# Patient Record
Sex: Female | Born: 1990
Health system: Southern US, Community
[De-identification: ages and names within clinical notes are randomized; demographics above are authoritative.]

## PROBLEM LIST (undated history)

## (undated) DIAGNOSIS — J45909 Unspecified asthma, uncomplicated: Secondary | ICD-10-CM

---
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
## 2012-01-01 ENCOUNTER — Encounter: Payer: BC Managed Care – PPO | Admitting: Physical Therapy

## 2012-01-07 ENCOUNTER — Ambulatory Visit: Payer: BC Managed Care – PPO | Admitting: Physical Therapy

## 2012-01-28 ENCOUNTER — Telehealth: Payer: Self-pay | Admitting: Radiology

## 2012-01-28 NOTE — Telephone Encounter (Signed)
Have gotten a request from [facility] for Tens unit/ please advise if you want to fill out medical necessity form for this item. I will fill out, you can sign, or if not appropriate let me know.

## 2012-01-30 NOTE — Telephone Encounter (Signed)
Only saw her once We need to be involved if ordering this equipment Any notes from PT to review??? Seems to long to be in pt w/out other imaging I need to see her before doing this

## 2012-01-30 NOTE — Telephone Encounter (Signed)
Called patient and advised, she states she has already gotten the TENS unsure who authorized this.

## 2012-02-02 ENCOUNTER — Telehealth: Payer: Self-pay

## 2012-02-02 NOTE — Telephone Encounter (Signed)
Please answer this phone call for me as I will not be here on Tuesday Note that we have not seen the patient since October and I'm not sure we are the ones to be responsible for signing for Tens unit

## 2012-02-02 NOTE — Telephone Encounter (Signed)
FOR DR Merla RichesVictorino Dike IS A PHYSICAL THERAPIST FROM [employer] AND WOULD LIKE TO SPEAK WITH YOU REGARDING A TENS UNIT ON PT PLEASE CALL [phone]

## 2012-02-03 NOTE — Telephone Encounter (Signed)
Yes, I called patient last week about the TENS unit, and we needed to see patient. She had already gotten the unit. I am not sure why they gave her the unit without your order. Patient was advised she needs office visit. I left message for her to call me back.

## 2012-02-03 NOTE — Telephone Encounter (Signed)
I spoke to her and she advised there is a signed Rx for this. She states you signed the order for this on Dec 4th. I have advised therapy patient has to be seen for this. Sherry Phelps Patient will come in for return office visit.

## 2012-09-06 ENCOUNTER — Telehealth: Payer: Self-pay

## 2012-09-06 NOTE — Telephone Encounter (Signed)
Request for records to be sent.

## 2012-09-06 NOTE — Telephone Encounter (Signed)
SONIA FROM [facility] STATES THEY NEED THE LAST AND CURRENT OV NOTES ON PT REGARDING THE 10 UNITS THAT WAS DELIVERED ON PT YOU MAY CALL HER AT [phone] AND THE FAX IS [phone]

## 2012-09-07 NOTE — Telephone Encounter (Signed)
Faxed

## 2013-02-23 ENCOUNTER — Emergency Department (HOSPITAL_COMMUNITY): Payer: BC Managed Care – PPO

## 2013-02-23 ENCOUNTER — Encounter (HOSPITAL_COMMUNITY): Payer: Self-pay | Admitting: Emergency Medicine

## 2013-02-23 ENCOUNTER — Emergency Department (HOSPITAL_COMMUNITY)
Admission: EM | Admit: 2013-02-23 | Discharge: 2013-02-23 | Disposition: A | Discharge status: Home / Self Care | Payer: BC Managed Care – PPO | Attending: Emergency Medicine | Admitting: Emergency Medicine

## 2013-02-23 DIAGNOSIS — R519 Headache, unspecified: Secondary | ICD-10-CM

## 2013-02-23 DIAGNOSIS — IMO0001 Reserved for inherently not codable concepts without codable children: Secondary | ICD-10-CM | POA: Insufficient documentation

## 2013-02-23 DIAGNOSIS — J45909 Unspecified asthma, uncomplicated: Secondary | ICD-10-CM | POA: Insufficient documentation

## 2013-02-23 DIAGNOSIS — R51 Headache: Secondary | ICD-10-CM | POA: Insufficient documentation

## 2013-02-23 DIAGNOSIS — Z79899 Other long term (current) drug therapy: Secondary | ICD-10-CM | POA: Insufficient documentation

## 2013-02-23 DIAGNOSIS — R52 Pain, unspecified: Secondary | ICD-10-CM | POA: Insufficient documentation

## 2013-02-23 DIAGNOSIS — J40 Bronchitis, not specified as acute or chronic: Secondary | ICD-10-CM

## 2013-02-23 HISTORY — DX: Unspecified asthma, uncomplicated: J45.909

## 2013-02-23 MED ORDER — SODIUM CHLORIDE 0.9 % IV BOLUS (SEPSIS)
1000.0000 mL | Freq: Once | INTRAVENOUS | Status: AC
  Administered 2013-02-23: 1000 mL via INTRAVENOUS

## 2013-02-23 MED ORDER — PREDNISONE 20 MG PO TABS: 40.0000 mg | ORAL_TABLET | Freq: Every day | ORAL | Status: DC

## 2013-02-23 MED ORDER — METOCLOPRAMIDE HCL 5 MG/ML IJ SOLN
10.0000 mg | Freq: Once | INTRAMUSCULAR | Status: AC
  Administered 2013-02-23: 10 mg via INTRAVENOUS
  Filled 2013-02-23: qty 2

## 2013-02-23 MED ORDER — DIPHENHYDRAMINE HCL 50 MG/ML IJ SOLN
25.0000 mg | Freq: Once | INTRAMUSCULAR | Status: AC
  Administered 2013-02-23: 25 mg via INTRAVENOUS
  Filled 2013-02-23: qty 1

## 2013-02-23 MED ORDER — ALBUTEROL SULFATE HFA 108 (90 BASE) MCG/ACT IN AERS: 2.0000 | INHALATION_SPRAY | Freq: Four times a day (QID) | RESPIRATORY_TRACT | Status: DC | PRN

## 2013-02-23 MED ORDER — METOCLOPRAMIDE HCL 10 MG PO TABS: 10.0000 mg | ORAL_TABLET | Freq: Three times a day (TID) | ORAL | Status: DC | PRN

## 2013-02-23 MED ORDER — PROCHLORPERAZINE EDISYLATE 5 MG/ML IJ SOLN
10.0000 mg | Freq: Once | INTRAMUSCULAR | Status: AC
  Administered 2013-02-23: 10 mg via INTRAVENOUS
  Filled 2013-02-23: qty 2

## 2013-02-23 NOTE — ED Provider Notes (Signed)
CSN: [id number]     Arrival date & time 02/23/13  1253 History  This chart was scribed for Francee Piccolo, PA-C, working with Shanna Cisco, MD by Blanchard Kelch, ED Scribe. This patient was seen in room TR06C/TR06C and the patient's care was started at 1:20 PM.    Chief Complaint  Patient presents with  . Cough  . Generalized Body Aches  . Headache    Patient is a 23 y.o. female presenting with cough and headaches. The history is provided by the patient. No language interpreter was used.  Cough Associated symptoms: chills, headaches and myalgias   Headache Associated symptoms: cough and myalgias     HPI Comments: Sherry Phelps is a 23 y.o. female with a history of asthma who presents to the Emergency Department complaining of a constant, dry cough that began about two days ago. She reports associated chills and myalgias. She states that yesterday she had to use her inhaler due to shortness of breath from the cough that subsided with the inhaler use. Patient does endorse SOB was similar to asthma exacerbation symptoms, denies any further SOB today. She also reports a waxing and waning headache that began yesterday. She reports taking one dose of Ibuprofen and Nyquil without relief. She does endorse minimal improvement of her headache with those, along with myalgias. She denies vomiting, current SOB, chest pain or unilateral leg swelling. She denies a history DVT/PE. She denies recent surgeries, fractures, recent long distance travel. She uses Nuvaring for birth control. She denies sick contacts. She did not receive her flu vaccination this season.     Past Medical History  Diagnosis Date  . Asthma    History reviewed. No pertinent past surgical history. No family history on file. History  Substance Use Topics  . Smoking status: Never Smoker   . Smokeless tobacco: Not on file  . Alcohol Use: Yes   OB History   Grav Para Term Preterm Abortions TAB SAB Ect Mult Living                  Review of Systems  Constitutional: Positive for chills.  Respiratory: Positive for cough.   Musculoskeletal: Positive for myalgias.  Neurological: Positive for headaches. Negative for syncope.  All other systems reviewed and are negative.    Allergies  Review of patient's allergies indicates no known allergies.  Home Medications   Current Outpatient Rx  Name  Route  Sig  Dispense  Refill  . albuterol (PROVENTIL HFA;VENTOLIN HFA) 108 (90 BASE) MCG/ACT inhaler   Inhalation   Inhale 1-2 puffs into the lungs every 6 (six) hours as needed for wheezing or shortness of breath.         Marland Kitchen albuterol (PROVENTIL) (2.5 MG/3ML) 0.083% nebulizer solution   Nebulization   Take 2.5 mg by nebulization every 6 (six) hours as needed for wheezing or shortness of breath.          . budesonide-formoterol (SYMBICORT) 160-4.5 MCG/ACT inhaler   Inhalation   Inhale 2 puffs into the lungs 2 (two) times daily.         Marland Kitchen etonogestrel-ethinyl estradiol (NUVARING) 0.12-0.015 MG/24HR vaginal ring   Vaginal   Place 1 each vaginally every 28 (twenty-eight) days. Insert vaginally and leave in place for 3 consecutive weeks, then remove for 1 week.         Marland Kitchen albuterol (PROVENTIL HFA;VENTOLIN HFA) 108 (90 BASE) MCG/ACT inhaler   Inhalation   Inhale 2 puffs into the lungs every 6 (  six) hours as needed for wheezing or shortness of breath.   1 Inhaler   2   . metoCLOPramide (REGLAN) 10 MG tablet   Oral   Take 1 tablet (10 mg total) by mouth 3 (three) times daily as needed for nausea (headache / nausea).   10 tablet   0   . predniSONE (DELTASONE) 20 MG tablet   Oral   Take 2 tablets (40 mg total) by mouth daily.   10 tablet   0    Triage Vitals: BP 132/84  Pulse 126  Temp(Src) 99.4 F (37.4 C) (Oral)  Resp 18  Ht 5\' 3"  (1.6 m)  Wt 150 lb (68.04 kg)  BMI 26.58 kg/m2  SpO2 100%  LMP 02/01/2013  Physical Exam  Nursing note and vitals reviewed. Constitutional: She is oriented to  person, place, and time. She appears well-developed and well-nourished. No distress.  HENT:  Head: Normocephalic and atraumatic.  Right Ear: External ear normal.  Left Ear: External ear normal.  Nose: Nose normal.  Mouth/Throat: Uvula is midline and oropharynx is clear and moist. No oropharyngeal exudate, posterior oropharyngeal edema or posterior oropharyngeal erythema.  Eyes: Conjunctivae and EOM are normal. Pupils are equal, round, and reactive to light.  Neck: Normal range of motion. Neck supple.  Cardiovascular: Normal rate, regular rhythm, normal heart sounds and intact distal pulses.   Pulmonary/Chest: Effort normal and breath sounds normal. No respiratory distress. She exhibits no tenderness.  Abdominal: Soft. There is no tenderness.  Musculoskeletal: Normal range of motion.  Lymphadenopathy:    She has no cervical adenopathy.  Neurological: She is alert and oriented to person, place, and time. She has normal strength. No cranial nerve deficit or sensory deficit. Gait normal. GCS eye subscore is 4. GCS verbal subscore is 5. GCS motor subscore is 6.  No pronator drift. Bilateral heel-knee-shin intact.  Skin: Skin is warm and dry. She is not diaphoretic.  Psychiatric: She has a normal mood and affect.    ED Course  Procedures (including critical care time) Medications  sodium chloride 0.9 % bolus 1,000 mL (0 mLs Intravenous Stopped 02/23/13 1516)  diphenhydrAMINE (BENADRYL) injection 25 mg (25 mg Intravenous Given 02/23/13 1347)  prochlorperazine (COMPAZINE) injection 10 mg (10 mg Intravenous Given 02/23/13 1347)  metoCLOPramide (REGLAN) injection 10 mg (10 mg Intravenous Given 02/23/13 1457)     DIAGNOSTIC STUDIES: Oxygen Saturation is 100% on room air, normal by my interpretation.    COORDINATION OF CARE: 1:25 PM -Will order chest x-ray. Patient verbalizes understanding and agrees with treatment plan.    Labs Review Labs Reviewed - No data to display Imaging Review Dg  Chest 2 View  02/23/2013   CLINICAL DATA:  Cough and congestion, history of asthma.  EXAM: CHEST  2 VIEW  COMPARISON:  Chest and left rib series dated November 05, 2011  FINDINGS: The lungs are well-expanded. There is no focal infiltrate. The cardiac silhouette is normal in size. The mediastinum is normal in width. The perihilar interstitial markings are minimally prominent but stable. There is no pleural effusion or pneumothorax. The observed portions of the bony thorax appear normal.  IMPRESSION: There is no evidence of pneumonia nor CHF. One cannot exclude acute bronchitis in the appropriate clinical setting.   Electronically Signed   By: David  [place]   On: 02/23/2013 14:19    EKG Interpretation   None       MDM   1. Bronchitis   2. Headache     Filed  Vitals:   02/23/13 1410  BP:   Pulse: 101  Temp:   Resp:    Afebrile, NAD, non-toxic appearing, AAOx4.   1) Bronchitis: Lungs clear to auscultation bilaterally. Dry cough appreciated on examination. No respiratory distress. No hypoxia. Patient initially tachycardic on arrival. This improved with IV fluids and pain medication. Patient without shortness of breath, hypoxia, chest pain. Symptoms related to asthma exacerbation with bronchitis. Will treat with Prednisone and inhaler.   2) Headache: Pt HA treated and improved while in ED.  Presentation is non concerning for [facility], ICH, Meningitis, or temporal arteritis. Pt is afebrile with no focal neuro deficits, nuchal rigidity, or change in vision. Pt is to follow up with PCP to discuss prophylactic medication. Pt verbalizes understanding and is agreeable with plan to dc.   Patient d/w with Dr. Micheline Maze, agrees with plan.     I personally performed the services described in this documentation, which was scribed in my presence. The recorded information has been reviewed and is accurate.     Jeannetta Ellis, PA-C 02/23/13 1538

## 2013-02-23 NOTE — ED Notes (Signed)
Denies SOB.

## 2013-02-23 NOTE — ED Notes (Signed)
PT ambulated with baseline gait; VSS; A&Ox3; no signs of distress; respirations even and unlabored; skin warm and dry; no questions upon discharge.  

## 2013-02-23 NOTE — ED Provider Notes (Signed)
Medical screening examination/treatment/procedure(s) were performed by non-physician practitioner and as supervising physician I was immediately available for consultation/collaboration.  EKG Interpretation   None         Megan E Docherty, MD 02/23/13 2002 

## 2013-02-23 NOTE — ED Notes (Signed)
Patient states has had cough, headache and body aches.   Patient states cough is dry.   Patient 8/10 headache/body aches.

## 2013-02-23 NOTE — Discharge Instructions (Signed)
Please follow up with your primary care physician in 1-2 days. If you do not have one [facility] listed above. Please take medications as prescribed. Please read all discharge instructions and return precautions.   Bronchitis Bronchitis is inflammation of the airways that extend from the windpipe into the lungs (bronchi). The inflammation often causes mucus to develop, which leads to a cough. If the inflammation becomes severe, it may cause shortness of breath. CAUSES  Bronchitis may be caused by:   Viral infections.   Bacteria.   Cigarette smoke.   Allergens, pollutants, and other irritants.  SIGNS AND SYMPTOMS  The most common symptom of bronchitis is a frequent cough that produces mucus. Other symptoms include:  Fever.   Body aches.   Chest congestion.   Chills.   Shortness of breath.   Sore throat.  DIAGNOSIS  Bronchitis is usually diagnosed through a medical history and physical exam. Tests, such as chest X-rays, are sometimes done to rule out other conditions.  TREATMENT  You may need to avoid contact with whatever caused the problem (smoking, for example). Medicines are sometimes needed. These may include:  Antibiotics. These may be prescribed if the condition is caused by bacteria.  Cough suppressants. These may be prescribed for relief of cough symptoms.   Inhaled medicines. These may be prescribed to help open your airways and make it easier for you to breathe.   Steroid medicines. These may be prescribed for those with recurrent (chronic) bronchitis. HOME CARE INSTRUCTIONS  Get plenty of rest.   Drink enough fluids to keep your urine clear or pale yellow (unless you have a medical condition that requires fluid restriction). Increasing fluids may help thin your secretions and will prevent dehydration.   Only take over-the-counter or prescription medicines as directed by your health care provider.  Only  take antibiotics as directed. Make sure you finish them even if you start to feel better.  Avoid secondhand smoke, irritating chemicals, and strong fumes. These will make bronchitis worse. If you are a smoker, quit smoking. Consider using nicotine gum or skin patches to help control withdrawal symptoms. Quitting smoking will help your lungs heal faster.   Put a cool-mist humidifier in your bedroom at night to moisten the air. This may help loosen mucus. Change the water in the humidifier daily. You can also run the hot water in your shower and sit in the bathroom with the door closed for 5 10 minutes.   Follow up with your health care provider as directed.   Wash your hands frequently to avoid catching bronchitis again or spreading an infection to others.  SEEK MEDICAL CARE IF: Your symptoms do not improve after 1 week of treatment.  SEEK IMMEDIATE MEDICAL CARE IF:  Your fever increases.  You have chills.   You have chest pain.   You have worsening shortness of breath.   You have bloody sputum.  You faint.  You have lightheadedness.  You have a severe headache.   You vomit repeatedly. MAKE SURE YOU:   Understand these instructions.  Will watch your condition.  Will get help right away if you are not doing well or get worse. Document Released: 01/13/2005 Document Revised: 11/03/2012 Document Reviewed: 09/07/2012 [facility] Patient Information 2014 [employer] [place]. Headaches, Frequently Asked Questions MIGRAINE HEADACHES Q: What is migraine? What causes it? How can I treat it? A: Generally, migraine headaches begin as a dull ache. Then they develop into a constant, throbbing,  and pulsating pain. You may experience pain at the temples. You may experience pain at the front or back of one or both sides of the head. The pain is usually accompanied by a combination of:  Nausea.  Vomiting.  Sensitivity to light and noise. Some people (about 15%) experience an aura  (see below) before an attack. The cause of migraine is believed to be chemical reactions in the brain. Treatment for migraine may include over-the-counter or prescription medications. It may also include self-help techniques. These include relaxation training and biofeedback.  Q: What is an aura? A: About 15% of people with migraine get an "aura". This is a sign of neurological symptoms that occur before a migraine headache. You may see wavy or jagged lines, dots, or flashing lights. You might experience tunnel vision or blind spots in one or both eyes. The aura can include visual or auditory hallucinations (something imagined). It may include disruptions in smell (such as strange odors), taste or touch. Other symptoms include:  Numbness.  A "pins and needles" sensation.  Difficulty in recalling or speaking the correct word. These neurological events may last as long as 60 minutes. These symptoms will fade as the headache begins. Q: What is a trigger? A: Certain physical or environmental factors can lead to or "trigger" a migraine. These include:  Foods.  Hormonal changes.  Weather.  Stress. It is important to remember that triggers are different for everyone. To help prevent migraine attacks, you need to figure out which triggers affect you. Keep a headache diary. This is a good way to track triggers. The diary will help you talk to your healthcare professional about your condition. Q: Does weather affect migraines? A: Bright sunshine, hot, humid conditions, and drastic changes in barometric pressure may lead to, or "trigger," a migraine attack in some people. But studies have shown that weather does not act as a trigger for everyone with migraines. Q: What is the link between migraine and hormones? A: Hormones start and regulate many of your body's functions. Hormones keep your body in balance within a constantly changing environment. The levels of hormones in your body are unbalanced at  times. Examples are during menstruation, pregnancy, or menopause. That can lead to a migraine attack. In fact, about three quarters of all women with migraine report that their attacks are related to the menstrual cycle.  Q: Is there an increased risk of stroke for migraine sufferers? A: The likelihood of a migraine attack causing a stroke is very remote. That is not to say that migraine sufferers cannot have a stroke associated with their migraines. In persons under age 19, the most common associated factor for stroke is migraine headache. But over the course of a person's normal life span, the occurrence of migraine headache may actually be associated with a reduced risk of dying from cerebrovascular disease due to stroke.  Q: What are acute medications for migraine? A: Acute medications are used to treat the pain of the headache after it has started. Examples over-the-counter medications, NSAIDs, ergots, and triptans.  Q: What are the triptans? A: Triptans are the newest class of abortive medications. They are specifically targeted to treat migraine. Triptans are vasoconstrictors. They moderate some chemical reactions in the brain. The triptans work on receptors in your brain. Triptans help to restore the balance of a neurotransmitter called serotonin. Fluctuations in levels of serotonin are thought to be a main cause of migraine.  Q: Are over-the-counter medications for migraine effective? A: Over-the-counter,  or "OTC," medications may be effective in relieving mild to moderate pain and associated symptoms of migraine. But you should see your caregiver before beginning any treatment regimen for migraine.  Q: What are preventive medications for migraine? A: Preventive medications for migraine are sometimes referred to as "prophylactic" treatments. They are used to reduce the frequency, severity, and length of migraine attacks. Examples of preventive medications include antiepileptic medications,  antidepressants, beta-blockers, calcium channel blockers, and NSAIDs (nonsteroidal anti-inflammatory drugs). Q: Why are anticonvulsants used to treat migraine? A: During the past few years, there has been an increased interest in antiepileptic drugs for the prevention of migraine. They are sometimes referred to as "anticonvulsants". Both epilepsy and migraine may be caused by similar reactions in the brain.  Q: Why are antidepressants used to treat migraine? A: Antidepressants are typically used to treat people with depression. They may reduce migraine frequency by regulating chemical levels, such as serotonin, in the brain.  Q: What alternative therapies are used to treat migraine? A: The term "alternative therapies" is often used to describe treatments considered outside the scope of conventional Western medicine. Examples of alternative therapy include acupuncture, acupressure, and yoga. Another common alternative treatment is herbal therapy. Some herbs are believed to relieve headache pain. Always discuss alternative therapies with your caregiver before proceeding. Some herbal products contain arsenic and other toxins. TENSION HEADACHES Q: What is a tension-type headache? What causes it? How can I treat it? A: Tension-type headaches occur randomly. They are often the result of temporary stress, anxiety, fatigue, or anger. Symptoms include soreness in your temples, a tightening band-like sensation around your head (a "vice-like" ache). Symptoms can also include a pulling feeling, pressure sensations, and contracting head and neck muscles. The headache begins in your forehead, temples, or the back of your head and neck. Treatment for tension-type headache may include over-the-counter or prescription medications. Treatment may also include self-help techniques such as relaxation training and biofeedback. CLUSTER HEADACHES Q: What is a cluster headache? What causes it? How can I treat it? A: Cluster  headache gets its name because the attacks come in groups. The pain arrives with little, if any, warning. It is usually on one side of the head. A tearing or bloodshot eye and a runny nose on the same side of the headache may also accompany the pain. Cluster headaches are believed to be caused by chemical reactions in the brain. They have been described as the most severe and intense of any headache type. Treatment for cluster headache includes prescription medication and oxygen. SINUS HEADACHES Q: What is a sinus headache? What causes it? How can I treat it? A: When a cavity in the bones of the face and skull (a sinus) becomes inflamed, the inflammation will cause localized pain. This condition is usually the result of an allergic reaction, a tumor, or an infection. If your headache is caused by a sinus blockage, such as an infection, you will probably have a fever. An x-ray will confirm a sinus blockage. Your caregiver's treatment might include antibiotics for the infection, as well as antihistamines or decongestants.  REBOUND HEADACHES Q: What is a rebound headache? What causes it? How can I treat it? A: A pattern of taking acute headache medications too often can lead to a condition known as "rebound headache." A pattern of taking too much headache medication includes taking it more than 2 days per week or in excessive amounts. That means more than the label or a caregiver advises. With rebound  headaches, your medications not only stop relieving pain, they actually begin to cause headaches. Doctors treat rebound headache by tapering the medication that is being overused. Sometimes your caregiver will gradually substitute a different type of treatment or medication. Stopping may be a challenge. Regularly overusing a medication increases the potential for serious side effects. Consult a caregiver if you regularly use headache medications more than 2 days per week or more than the label advises. ADDITIONAL  QUESTIONS AND ANSWERS Q: What is biofeedback? A: Biofeedback is a self-help treatment. Biofeedback uses special equipment to monitor your body's involuntary physical responses. Biofeedback monitors:  Breathing.  Pulse.  Heart rate.  Temperature.  Muscle tension.  Brain activity. Biofeedback helps you refine and perfect your relaxation exercises. You learn to control the physical responses that are related to stress. Once the technique has been mastered, you do not need the equipment any more. Q: Are headaches hereditary? A: Four out of five (80%) of people that suffer report a family history of migraine. Scientists are not sure if this is genetic or a family predisposition. Despite the uncertainty, a child has a 50% chance of having migraine if one parent suffers. The child has a 75% chance if both parents suffer.  Q: Can children get headaches? A: By the time they reach high school, most young people have experienced some type of headache. Many safe and effective approaches or medications can prevent a headache from occurring or stop it after it has begun.  Q: What type of doctor should I see to diagnose and treat my headache? A: Start with your primary caregiver. Discuss his or her experience and approach to headaches. Discuss methods of classification, diagnosis, and treatment. Your caregiver may decide to recommend you to a headache specialist, depending upon your symptoms or other physical conditions. Having diabetes, allergies, etc., may require a more comprehensive and inclusive approach to your headache. The [employer] will provide, upon request, a list of [facility] physician members in your state. Document Released: 04/05/2003 Document Revised: 04/07/2011 Document Reviewed: 09/13/2007 [facility] Patient Information 2014 [place], [place].

## 2013-11-02 ENCOUNTER — Encounter (HOSPITAL_COMMUNITY): Payer: Self-pay | Admitting: Emergency Medicine

## 2013-11-02 ENCOUNTER — Emergency Department (HOSPITAL_COMMUNITY)
Admission: EM | Admit: 2013-11-02 | Discharge: 2013-11-02 | Disposition: A | Discharge status: Home / Self Care | Payer: Federal, State, Local not specified - PPO | Attending: Emergency Medicine | Admitting: Emergency Medicine

## 2013-11-02 ENCOUNTER — Emergency Department (HOSPITAL_COMMUNITY): Payer: Federal, State, Local not specified - PPO

## 2013-11-02 ENCOUNTER — Emergency Department (HOSPITAL_COMMUNITY)
Admission: EM | Admit: 2013-11-02 | Discharge: 2013-11-02 | Disposition: A | Discharge status: Home / Self Care | Payer: Federal, State, Local not specified - PPO | Source: Home / Self Care | Attending: Family Medicine | Admitting: Family Medicine

## 2013-11-02 DIAGNOSIS — Z7952 Long term (current) use of systemic steroids: Secondary | ICD-10-CM | POA: Diagnosis not present

## 2013-11-02 DIAGNOSIS — R000 Tachycardia, unspecified: Secondary | ICD-10-CM | POA: Diagnosis not present

## 2013-11-02 DIAGNOSIS — J069 Acute upper respiratory infection, unspecified: Secondary | ICD-10-CM | POA: Diagnosis not present

## 2013-11-02 DIAGNOSIS — Z79899 Other long term (current) drug therapy: Secondary | ICD-10-CM | POA: Diagnosis not present

## 2013-11-02 DIAGNOSIS — R0602 Shortness of breath: Secondary | ICD-10-CM | POA: Diagnosis present

## 2013-11-02 DIAGNOSIS — J45901 Unspecified asthma with (acute) exacerbation: Secondary | ICD-10-CM | POA: Diagnosis not present

## 2013-11-02 LAB — CBC WITH DIFFERENTIAL/PLATELET
Basophils Absolute: 0 10*3/uL (ref 0.0–0.1)
Basophils Relative: 0 % (ref 0–1)
Eosinophils Absolute: 0.3 10*3/uL (ref 0.0–0.7)
Eosinophils Relative: 4 % (ref 0–5)
HCT: 38.1 % (ref 36.0–46.0)
Hemoglobin: 13.3 g/dL (ref 12.0–15.0)
LYMPHS ABS: 0.7 10*3/uL (ref 0.7–4.0)
Lymphocytes Relative: 9 % — ABNORMAL LOW (ref 12–46)
MCH: 29.5 pg (ref 26.0–34.0)
MCHC: 34.9 g/dL (ref 30.0–36.0)
MCV: 84.5 fL (ref 78.0–100.0)
Monocytes Absolute: 0.2 10*3/uL (ref 0.1–1.0)
Monocytes Relative: 3 % (ref 3–12)
NEUTROS ABS: 5.9 10*3/uL (ref 1.7–7.7)
Neutrophils Relative %: 84 % — ABNORMAL HIGH (ref 43–77)
PLATELETS: 262 10*3/uL (ref 150–400)
RBC: 4.51 MIL/uL (ref 3.87–5.11)
RDW: 13.2 % (ref 11.5–15.5)
WBC: 7 10*3/uL (ref 4.0–10.5)

## 2013-11-02 LAB — BASIC METABOLIC PANEL
Anion gap: 11 (ref 5–15)
BUN: 9 mg/dL (ref 6–23)
CO2: 23 mEq/L (ref 19–32)
Calcium: 9 mg/dL (ref 8.4–10.5)
Chloride: 103 mEq/L (ref 96–112)
Creatinine, Ser: 0.76 mg/dL (ref 0.50–1.10)
GFR calc Af Amer: >90 mL/min (ref >90)
GFR calc non Af Amer: >90 mL/min (ref >90)
GLUCOSE: 104 mg/dL — AB (ref 70–99)
POTASSIUM: 3.9 meq/L (ref 3.7–5.3)
Sodium: 137 mEq/L (ref 137–147)

## 2013-11-02 LAB — D-DIMER, QUANTITATIVE: D-Dimer, Quant: <0.27 ug/mL-FEU (ref 0.00–0.48)

## 2013-11-02 MED ORDER — BUDESONIDE-FORMOTEROL FUMARATE 160-4.5 MCG/ACT IN AERO: 2.0000 | INHALATION_SPRAY | Freq: Two times a day (BID) | RESPIRATORY_TRACT | Status: AC

## 2013-11-02 MED ORDER — ALBUTEROL SULFATE (2.5 MG/3ML) 0.083% IN NEBU
5.0000 mg | INHALATION_SOLUTION | Freq: Once | RESPIRATORY_TRACT | Status: AC
  Administered 2013-11-02: 5 mg via RESPIRATORY_TRACT
  Filled 2013-11-02: qty 6

## 2013-11-02 MED ORDER — IPRATROPIUM BROMIDE 0.02 % IN SOLN
0.5000 mg | Freq: Once | RESPIRATORY_TRACT | Status: AC
  Administered 2013-11-02: 0.5 mg via RESPIRATORY_TRACT
  Filled 2013-11-02: qty 2.5

## 2013-11-02 MED ORDER — ALBUTEROL SULFATE HFA 108 (90 BASE) MCG/ACT IN AERS
1.0000 | INHALATION_SPRAY | Freq: Four times a day (QID) | RESPIRATORY_TRACT | Status: DC
  Administered 2013-11-02: 2 via RESPIRATORY_TRACT
  Filled 2013-11-02: qty 6.7

## 2013-11-02 MED ORDER — PREDNISONE 50 MG PO TABS: 50.0000 mg | ORAL_TABLET | Freq: Every day | ORAL | Status: DC

## 2013-11-02 MED ORDER — IPRATROPIUM-ALBUTEROL 0.5-2.5 (3) MG/3ML IN SOLN
3.0000 mL | Freq: Once | RESPIRATORY_TRACT | Status: AC
  Administered 2013-11-02: 3 mL via RESPIRATORY_TRACT

## 2013-11-02 MED ORDER — IPRATROPIUM-ALBUTEROL 0.5-2.5 (3) MG/3ML IN SOLN
RESPIRATORY_TRACT | Status: AC
  Filled 2013-11-02: qty 3

## 2013-11-02 MED ORDER — ACETAMINOPHEN 325 MG PO TABS
650.0000 mg | ORAL_TABLET | Freq: Once | ORAL | Status: AC
  Administered 2013-11-02: 650 mg via ORAL
  Filled 2013-11-02: qty 2

## 2013-11-02 NOTE — ED Notes (Addendum)
[facility]. Pt given a neb tx and prescription for prednisone. Pt sts she left and started wheezing again. Pt also reports sore throat.

## 2013-11-02 NOTE — Discharge Instructions (Signed)
Upper Respiratory Infection, Adult °An upper respiratory infection (URI) is also sometimes known as the common cold. The upper respiratory tract includes the nose, sinuses, throat, trachea, and bronchi. Bronchi are the airways leading to the lungs. Most people improve within 1 week, but symptoms can last up to 2 weeks. A residual cough may last even longer.  °CAUSES °Many different viruses can infect the tissues lining the upper respiratory tract. The tissues become irritated and inflamed and often become very moist. Mucus production is also common. A cold is contagious. You can easily spread the virus to others by oral contact. This includes kissing, sharing a glass, coughing, or sneezing. Touching your mouth or nose and then touching a surface, which is then touched by another person, can also spread the virus. °SYMPTOMS  °Symptoms typically develop 1 to 3 days after you come in contact with a cold virus. Symptoms vary from person to person. They may include: °· Runny nose. °· Sneezing. °· Nasal congestion. °· Sinus irritation. °· Sore throat. °· Loss of voice (laryngitis). °· Cough. °· Fatigue. °· Muscle aches. °· Loss of appetite. °· Headache. °· Low-grade fever. °DIAGNOSIS  °You might diagnose your own cold based on familiar symptoms, since most people get a cold 2 to 3 times a year. Your caregiver can confirm this based on your exam. Most importantly, your caregiver can check that your symptoms are not due to another disease such as strep throat, sinusitis, pneumonia, asthma, or epiglottitis. Blood tests, throat tests, and X-rays are not necessary to diagnose a common cold, but they may sometimes be helpful in excluding other more serious diseases. Your caregiver will decide if any further tests are required. °RISKS AND COMPLICATIONS  °You may be at risk for a more severe case of the common cold if you smoke cigarettes, have chronic heart disease (such as heart failure) or lung disease (such as asthma), or if  you have a weakened immune system. The very young and very old are also at risk for more serious infections. Bacterial sinusitis, middle ear infections, and bacterial pneumonia can complicate the common cold. The common cold can worsen asthma and chronic obstructive pulmonary disease (COPD). Sometimes, these complications can require emergency medical care and may be life-threatening. °PREVENTION  °The best way to protect against getting a cold is to practice good hygiene. Avoid oral or hand contact with people with cold symptoms. Wash your hands often if contact occurs. There is no clear evidence that vitamin C, vitamin E, echinacea, or exercise reduces the chance of developing a cold. However, it is always recommended to get plenty of rest and practice good nutrition. °TREATMENT  °Treatment is directed at relieving symptoms. There is no cure. Antibiotics are not effective, because the infection is caused by a virus, not by bacteria. Treatment may include: °· Increased fluid intake. Sports drinks offer valuable electrolytes, sugars, and fluids. °· Breathing heated mist or steam (vaporizer or shower). °· Eating chicken soup or other clear broths, and maintaining good nutrition. °· Getting plenty of rest. °· Using gargles or lozenges for comfort. °· Controlling fevers with ibuprofen or acetaminophen as directed by your caregiver. °· Increasing usage of your inhaler if you have asthma. °Zinc gel and zinc lozenges, taken in the first 24 hours of the common cold, can shorten the duration and lessen the severity of symptoms. Pain medicines may help with fever, muscle aches, and throat pain. A variety of non-prescription medicines are available to treat congestion and runny nose. Your caregiver   can make recommendations and may suggest nasal or lung inhalers for other symptoms.  °HOME CARE INSTRUCTIONS  °· Only take over-the-counter or prescription medicines for pain, discomfort, or fever as directed by your  caregiver. °· Use a warm mist humidifier or inhale steam from a shower to increase air moisture. This may keep secretions moist and make it easier to breathe. °· Drink enough water and fluids to keep your urine clear or pale yellow. °· Rest as needed. °· Return to work when your temperature has returned to normal or as your caregiver advises. You may need to stay home longer to avoid infecting others. You can also use a face mask and careful hand washing to prevent spread of the virus. °SEEK MEDICAL CARE IF:  °· After the first few days, you feel you are getting worse rather than better. °· You need your caregiver's advice about medicines to control symptoms. °· You develop chills, worsening shortness of breath, or brown or red sputum. These may be signs of pneumonia. °· You develop yellow or brown nasal discharge or pain in the face, especially when you bend forward. These may be signs of sinusitis. °· You develop a fever, swollen neck glands, pain with swallowing, or white areas in the back of your throat. These may be signs of strep throat. °SEEK IMMEDIATE MEDICAL CARE IF:  °· You have a fever. °· You develop severe or persistent headache, ear pain, sinus pain, or chest pain. °· You develop wheezing, a prolonged cough, cough up blood, or have a change in your usual mucus (if you have chronic lung disease). °· You develop sore muscles or a stiff neck. °Document Released: 07/09/2000 Document Revised: 04/07/2011 Document Reviewed: 04/20/2013 °ExitCare® Patient Information ©2015 [employer]. This information is not intended to replace advice given to you by your health care provider. Make sure you discuss any questions you have with your health care provider. °Asthma °Asthma is a recurring condition in which the airways tighten and narrow. Asthma can make it difficult to breathe. It can cause coughing, wheezing, and shortness of breath. Asthma episodes, also called asthma attacks, range from minor to  life-threatening. Asthma cannot be cured, but medicines and lifestyle changes can help control it. °CAUSES °Asthma is believed to be caused by inherited (genetic) and environmental factors, but its exact cause is unknown. Asthma may be triggered by allergens, lung infections, or irritants in the air. Asthma triggers are different for each person. Common triggers include:  °· Animal dander. °· Dust mites. °· Cockroaches. °· Pollen from trees or grass. °· Mold. °· Smoke. °· Air pollutants such as dust, household cleaners, hair sprays, aerosol sprays, paint fumes, strong chemicals, or strong odors. °· Cold air, weather changes, and winds (which increase molds and pollens in the air). °· Strong emotional expressions such as crying or laughing hard. °· Stress. °· Certain medicines (such as aspirin) or types of drugs (such as beta-blockers). °· Sulfites in foods and drinks. Foods and drinks that may contain sulfites include dried fruit, potato chips, and sparkling grape juice. °· Infections or inflammatory conditions such as the flu, a cold, or an inflammation of the nasal membranes (rhinitis). °· Gastroesophageal reflux disease (GERD). °· Exercise or strenuous activity. °SYMPTOMS °Symptoms may occur immediately after asthma is triggered or many hours later. Symptoms include: °· Wheezing. °· Excessive nighttime or early morning coughing. °· Frequent or severe coughing with a common cold. °· Chest tightness. °· Shortness of breath. °DIAGNOSIS  °The diagnosis of asthma is   made by a review of your medical history and a physical exam. Tests may also be performed. These may include: °· Lung function studies. These tests show how much air you breathe in and out. °· Allergy tests. °· Imaging tests such as X-rays. °TREATMENT  °Asthma cannot be cured, but it can usually be controlled. Treatment involves identifying and avoiding your asthma triggers. It also involves medicines. There are 2 classes of medicine used for asthma  treatment:  °· Controller medicines. These prevent asthma symptoms from occurring. They are usually taken every day. °· Reliever or rescue medicines. These quickly relieve asthma symptoms. They are used as needed and provide short-term relief. °Your health care provider will help you create an asthma action plan. An asthma action plan is a written plan for managing and treating your asthma attacks. It includes a list of your asthma triggers and how they may be avoided. It also includes information on when medicines should be taken and when their dosage should be changed. An action plan may also involve the use of a device called a peak flow meter. A peak flow meter measures how well the lungs are working. It helps you monitor your condition. °HOME CARE INSTRUCTIONS  °· Take medicines only as directed by your health care provider. Speak with your health care provider if you have questions about how or when to take the medicines. °· Use a peak flow meter as directed by your health care provider. Record and keep track of readings. °· Understand and use the action plan to help minimize or stop an asthma attack without needing to seek medical care. °· Control your home environment in the following ways to help prevent asthma attacks: °¨ Do not smoke. Avoid being exposed to secondhand smoke. °¨ Change your heating and air conditioning filter regularly. °¨ Limit your use of fireplaces and wood stoves. °¨ Get rid of pests (such as roaches and mice) and their droppings. °¨ Throw away plants if you see mold on them. °¨ Clean your floors and dust regularly. Use unscented cleaning products. °¨ Try to have someone else vacuum for you regularly. Stay out of rooms while they are being vacuumed and for a short while afterward. If you vacuum, use a dust mask from a hardware store, a double-layered or microfilter vacuum cleaner bag, or a vacuum cleaner with a HEPA filter. °¨ Replace carpet with wood, tile, or vinyl flooring. Carpet  can trap dander and dust. °¨ Use allergy-proof pillows, mattress covers, and box spring covers. °¨ Wash bed sheets and blankets every week in hot water and dry them in a dryer. °¨ Use blankets that are made of polyester or cotton. °¨ Clean bathrooms and kitchens with bleach. If possible, have someone repaint the walls in these rooms with mold-resistant paint. Keep out of the rooms that are being cleaned and painted. °¨ Wash hands frequently. °SEEK MEDICAL CARE IF:  °· You have wheezing, shortness of breath, or a cough even if taking medicine to prevent attacks. °· The colored mucus you cough up (sputum) is thicker than usual. °· Your sputum changes from clear or white to yellow, green, gray, or bloody. °· You have any problems that may be related to the medicines you are taking (such as a rash, itching, swelling, or trouble breathing). °· You are using a reliever medicine more than 2-3 times per week. °· Your peak flow is still at 50-79% of your personal best after following your action plan for 1 hour. °· You   have a fever. °SEEK IMMEDIATE MEDICAL CARE IF:  °· You seem to be getting worse and are unresponsive to treatment during an asthma attack. °· You are short of breath even at rest. °· You get short of breath when doing very little physical activity. °· You have difficulty eating, drinking, or talking due to asthma symptoms. °· You develop chest pain. °· You develop a fast heartbeat. °· You have a bluish color to your lips or fingernails. °· You are light-headed, dizzy, or faint. °· Your peak flow is less than 50% of your personal best. °MAKE SURE YOU:  °· Understand these instructions. °· Will watch your condition. °· Will get help right away if you are not doing well or get worse. °Document Released: 01/13/2005 Document Revised: 05/30/2013 Document Reviewed: 08/12/2012 °ExitCare® Patient Information ©2015 [employer]. This information is not intended to replace advice given to you by your health care  provider. Make sure you discuss any questions you have with your health care provider. ° °

## 2013-11-02 NOTE — ED Provider Notes (Signed)
Sherry Phelps is a 23 y.o. female who presents to Urgent Care today for asthma exacerbation. Patient has a 2-day history of worsening shortness of breath and wheezing associated with Raynaud's cough congestion and headache. She's been using her albuterol inhaler much more frequently than usual. It helps only temporarily. No fevers or chills nausea vomiting or diarrhea. No chest pains or palpitations   Past Medical History  Diagnosis Date  . Asthma    History  Substance Use Topics  . Smoking status: Never Smoker   . Smokeless tobacco: Not on file  . Alcohol Use: Yes   ROS as above Medications: No current facility-administered medications for this encounter.   Current Outpatient Prescriptions  Medication Sig Dispense Refill  . albuterol (PROVENTIL HFA;VENTOLIN HFA) 108 (90 BASE) MCG/ACT inhaler Inhale 1-2 puffs into the lungs every 6 (six) hours as needed for wheezing or shortness of breath.      Marland Kitchen. albuterol (PROVENTIL HFA;VENTOLIN HFA) 108 (90 BASE) MCG/ACT inhaler Inhale 2 puffs into the lungs every 6 (six) hours as needed for wheezing or shortness of breath.  1 Inhaler  2  . albuterol (PROVENTIL) (2.5 MG/3ML) 0.083% nebulizer solution Take 2.5 mg by nebulization every 6 (six) hours as needed for wheezing or shortness of breath.       . budesonide-formoterol (SYMBICORT) 160-4.5 MCG/ACT inhaler Inhale 2 puffs into the lungs 2 (two) times daily.  1 Inhaler  0  . etonogestrel-ethinyl estradiol (NUVARING) 0.12-0.015 MG/24HR vaginal ring Place 1 each vaginally every 28 (twenty-eight) days. Insert vaginally and leave in place for 3 consecutive weeks, then remove for 1 week.      . predniSONE (DELTASONE) 50 MG tablet Take 1 tablet (50 mg total) by mouth daily.  5 tablet  0  . [DISCONTINUED] metoCLOPramide (REGLAN) 10 MG tablet Take 1 tablet (10 mg total) by mouth 3 (three) times daily as needed for nausea (headache / nausea).  10 tablet  0    Exam:  BP 126/82  Pulse 109  Temp(Src) 97.9 F (36.6  C) (Oral)  Resp 20  SpO2 96%  LMP 10/21/2013 Gen: Well NAD HEENT: EOMI,  MMM Lungs: Normal work of breathing. Prolonged expiratory phase bilaterally associated with wheezing. Heart: RRR no MRG Abd: NABS, Soft. Nondistended, Nontender Exts: Brisk capillary refill, warm and well perfused.   Patient was given a DuoNeb nebulizer treatment 2.5 mg albuterol/0.5 mg Atrovent, and felt better.  No results found for this or any previous visit (from the past 24 hour(s)). No results found.  Assessment and Plan: 23 y.o. female with asthma exacerbation. Prednisone Symbicort albuterol followup with PCP.  Discussed warning signs or symptoms. Please see discharge instructions. Patient expresses understanding.     Rodolph BongEvan S Flara Storti, MD 11/02/13 [id number]

## 2013-11-02 NOTE — ED Notes (Signed)
PT refuses to have blood drawn because she is afraid of needles,

## 2013-11-02 NOTE — ED Provider Notes (Signed)
CSN: [id number]     Arrival date & time 11/02/13  1559 History  This chart was scribed for non-physician practitioner, Ladona Mow, PA-C working with Toy Cookey, MD by Greggory Stallion, ED scribe. This patient was seen in room TR05C/TR05C and the patient's care was started at 4:25 PM.   Chief Complaint  Patient presents with  . Asthma   The history is provided by the patient. No language interpreter was used.   HPI Comments: Sherry Phelps is a 23 y.o. female who presents to the Emergency Department complaining of worsening chest tightness, wheezing, SOB productive cough of brown sputum that started 2 days ago. States this is similar to past asthma exacerbations. Pt was evaluated at Urgent Care prior to coming here and was given a breathing treatment and prednisone. No chest xray was done. States she got home and started having wheezing and chest tightness. Pt used her rescue inhaler with no relief. Denies fever. Pt does not smoke.   Past Medical History  Diagnosis Date  . Asthma    History reviewed. No pertinent past surgical history. No family history on file. History  Substance Use Topics  . Smoking status: Never Smoker   . Smokeless tobacco: Not on file  . Alcohol Use: Yes   OB History   Grav Para Term Preterm Abortions TAB SAB Ect Mult Living                 Review of Systems  Constitutional: Negative for fever.  Respiratory: Positive for cough, chest tightness, shortness of breath and wheezing.   All other systems reviewed and are negative.  Allergies  Review of patient's allergies indicates no known allergies.  Home Medications   Prior to Admission medications   Medication Sig Start Date End Date Taking? Authorizing Provider  albuterol (PROVENTIL HFA;VENTOLIN HFA) 108 (90 BASE) MCG/ACT inhaler Inhale 1-2 puffs into the lungs every 6 (six) hours as needed for wheezing or shortness of breath.   Yes Historical Provider, MD  albuterol (PROVENTIL) (2.5 MG/3ML) 0.083% nebulizer  solution Take 2.5 mg by nebulization every 6 (six) hours as needed for wheezing or shortness of breath.    Yes Historical Provider, MD  budesonide-formoterol (SYMBICORT) 160-4.5 MCG/ACT inhaler Inhale 2 puffs into the lungs 2 (two) times daily. 11/02/13  Yes Rodolph Bong, MD  predniSONE (DELTASONE) 50 MG tablet Take 1 tablet (50 mg total) by mouth daily. 11/02/13  Yes Rodolph Bong, MD   BP 133/80  Pulse 97  Temp(Src) 99 F (37.2 C) (Oral)  Resp 18  Ht 5\' 3"  (1.6 m)  Wt 150 lb (68.04 kg)  BMI 26.58 kg/m2  SpO2 98%  LMP 10/21/2013  Physical Exam  Nursing note and vitals reviewed. Constitutional: She is oriented to person, place, and time. She appears well-developed and well-nourished. No distress.  HENT:  Head: Normocephalic and atraumatic.  Eyes: Conjunctivae and EOM are normal.  Neck: Neck supple. No tracheal deviation present.  Cardiovascular: Normal rate.   Pulmonary/Chest: Effort normal. No accessory muscle usage. Not tachypneic. No respiratory distress. She has wheezes in the right upper field, the right middle field and the left lower field.  Mild wheezes on exam.   Musculoskeletal: Normal range of motion.  Neurological: She is alert and oriented to person, place, and time.  Skin: Skin is warm and dry.  Psychiatric: She has a normal mood and affect. Her behavior is normal.    ED Course  Procedures (including critical care time)  DIAGNOSTIC STUDIES: Oxygen  Saturation is 100% on RA, normal by my interpretation.    COORDINATION OF CARE: 4:28 PM-Discussed treatment plan which includes chest xray and breathing treatment with pt at bedside and pt agreed to plan.   Labs Review Labs Reviewed  CBC WITH DIFFERENTIAL - Abnormal; Notable for the following:    Neutrophils Relative % 84 (*)    Lymphocytes Relative 9 (*)    All other components within normal limits  BASIC METABOLIC PANEL - Abnormal; Notable for the following:    Glucose, Bld 104 (*)    All other components within  normal limits  D-DIMER, QUANTITATIVE    Imaging Review Dg Chest 2 View  11/02/2013   CLINICAL DATA:  Chest tightness with wheezing and productive cough  EXAM: CHEST  2 VIEW  COMPARISON:  February 23, 2013  FINDINGS: Lungs are clear. Heart size and pulmonary vascularity are normal. No adenopathy. No bone lesions. No pneumothorax.  IMPRESSION: No abnormality noted.   Electronically Signed   By: Bretta Bang M.D.   On: 11/02/2013 18:34     EKG Interpretation   Date/Time:  Wednesday November 02 2013 17:02:07 EDT Ventricular Rate:  114 PR Interval:  126 QRS Duration: 82 QT Interval:  346 QTC Calculation: 476 R Axis:   66 Text Interpretation:  Sinus tachycardia Nonspecific T wave abnormality  Abnormal ECG No prior Confirmed by DOCHERTY  MD, MEGAN ([id number]) on 11/02/2013  5:35:27 PM      MDM   Final diagnoses:  Upper respiratory infection  Asthma exacerbation    Patient with 2 day history of worsening shortness of breath, cough, wheezing, chest tightness and was seen at the urgent care earlier today, diagnosed with bronchitis, given albuterol nebulizer and prednisone by mouth. Patient states she went home, began to have increasing wheezing, cough, chest tightness, and return to the ER. Patient was seen and evaluated today, and returned with return of symptoms, will follow up with chest x-ray, repeat DuoNeb treatment. Patient's nonsmoker, only risk factor for PE is currently on NuvaRing, we will also follow up with basic labs, d-dimer to help rule out PE as cause of patient's tachycardia, chest tightness.  EKG showing sinus tachycardia no nonspecific T wave abnormality and no acute changes. Patient's labs unremarkable. No leukocytosis, no obvious anemia, no electrolyte abnormality, renal function normal. Chest x-ray shows no abnormalities, no pneumonia or cardiopulmonary disease. D-dimer less than 0.27.  With negative d-dimer, chest tenderness to palpation and patient's symptoms resolving  with DuoNeb, presentation was concerning of PE. Patient's history physical exam inconsistent with cardiac etiology of her chest tightness, and most likely due to her asthma. Patient stating asthma attacks and exacerbations in the past that caused this identical chest tightness and chest tenderness with coughing. We will discharge patient at this time, and encourage her to continue her prednisone treatment, continue Symbicort, and followup with primary care physician. Patient requesting an albuterol inhaler since hers is out. We'll provide patient with rescue inhaler. I discussed strict return precautions with patient, and encourage her to call or return to ER should she have any questions or concerns.  BP 133/80  Pulse 97  Temp(Src) 99 F (37.2 C) (Oral)  Resp 18  Ht 5\' 3"  (1.6 m)  Wt 150 lb (68.04 kg)  BMI 26.58 kg/m2  SpO2 98%  LMP 10/21/2013  Signed,  Ladona Mow, PA-C 3:32 AM  This patient discussed with Dr. Toy Cookey, M.D.  I personally performed the services described in this documentation, which was scribed in  my presence. The recorded information has been reviewed and is accurate.  Monte FantasiaJoseph W Ardian Haberland, PA-C 11/03/13 [phone]

## 2013-11-02 NOTE — ED Notes (Signed)
C/o cold sx onset Monday Sx include: ST, productive cough, congestion, HA, SOB, wheezing Hx of asthma; taking her albuterol w/temp relief Denies f/v/n/d Alert, no signs of acute distress.

## 2013-11-02 NOTE — Discharge Instructions (Signed)
Thank you for coming in today. Continue albuterol as needed.  Restart symbicort.  Take prednisone daily for 1 week.  Call or go to the emergency room if you get worse, have trouble breathing, have chest pains, or palpitations.    Asthma Asthma is a recurring condition in which the airways tighten and narrow. Asthma can make it difficult to breathe. It can cause coughing, wheezing, and shortness of breath. Asthma episodes, also called asthma attacks, range from minor to life-threatening. Asthma cannot be cured, but medicines and lifestyle changes can help control it. CAUSES Asthma is believed to be caused by inherited (genetic) and environmental factors, but its exact cause is unknown. Asthma may be triggered by allergens, lung infections, or irritants in the air. Asthma triggers are different for each person. Common triggers include:   Animal dander.  Dust mites.  Cockroaches.  Pollen from trees or grass.  Mold.  Smoke.  Air pollutants such as dust, household cleaners, hair sprays, aerosol sprays, paint fumes, strong chemicals, or strong odors.  Cold air, weather changes, and winds (which increase molds and pollens in the air).  Strong emotional expressions such as crying or laughing hard.  Stress.  Certain medicines (such as aspirin) or types of drugs (such as beta-blockers).  Sulfites in foods and drinks. Foods and drinks that may contain sulfites include dried fruit, potato chips, and sparkling grape juice.  Infections or inflammatory conditions such as the flu, a cold, or an inflammation of the nasal membranes (rhinitis).  Gastroesophageal reflux disease (GERD).  Exercise or strenuous activity. SYMPTOMS Symptoms may occur immediately after asthma is triggered or many hours later. Symptoms include:  Wheezing.  Excessive nighttime or early morning coughing.  Frequent or severe coughing with a common cold.  Chest tightness.  Shortness of breath. DIAGNOSIS  The  diagnosis of asthma is made by a review of your medical history and a physical exam. Tests may also be performed. These may include:  Lung function studies. These tests show how much air you breathe in and out.  Allergy tests.  Imaging tests such as X-rays. TREATMENT  Asthma cannot be cured, but it can usually be controlled. Treatment involves identifying and avoiding your asthma triggers. It also involves medicines. There are 2 classes of medicine used for asthma treatment:   Controller medicines. These prevent asthma symptoms from occurring. They are usually taken every day.  Reliever or rescue medicines. These quickly relieve asthma symptoms. They are used as needed and provide short-term relief. Your health care provider will help you create an asthma action plan. An asthma action plan is a written plan for managing and treating your asthma attacks. It includes a list of your asthma triggers and how they may be avoided. It also includes information on when medicines should be taken and when their dosage should be changed. An action plan may also involve the use of a device called a peak flow meter. A peak flow meter measures how well the lungs are working. It helps you monitor your condition. HOME CARE INSTRUCTIONS   Take medicines only as directed by your health care provider. Speak with your health care provider if you have questions about how or when to take the medicines.  Use a peak flow meter as directed by your health care provider. Record and keep track of readings.  Understand and use the action plan to help minimize or stop an asthma attack without needing to seek medical care.  Control your home environment in the following  ways to help prevent asthma attacks:  Do not smoke. Avoid being exposed to secondhand smoke.  Change your heating and air conditioning filter regularly.  Limit your use of fireplaces and wood stoves.  Get rid of pests (such as roaches and mice) and  their droppings.  Throw away plants if you see mold on them.  Clean your floors and dust regularly. Use unscented cleaning products.  Try to have someone else vacuum for you regularly. Stay out of rooms while they are being vacuumed and for a short while afterward. If you vacuum, use a dust mask from a hardware store, a double-layered or microfilter vacuum cleaner bag, or a vacuum cleaner with a HEPA filter.  Replace carpet with wood, tile, or vinyl flooring. Carpet can trap dander and dust.  Use allergy-proof pillows, mattress covers, and box spring covers.  Wash bed sheets and blankets every week in hot water and dry them in a dryer.  Use blankets that are made of polyester or cotton.  Clean bathrooms and kitchens with bleach. If possible, have someone repaint the walls in these rooms with mold-resistant paint. Keep out of the rooms that are being cleaned and painted.  Wash hands frequently. SEEK MEDICAL CARE IF:   You have wheezing, shortness of breath, or a cough even if taking medicine to prevent attacks.  The colored mucus you cough up (sputum) is thicker than usual.  Your sputum changes from clear or white to yellow, green, gray, or bloody.  You have any problems that may be related to the medicines you are taking (such as a rash, itching, swelling, or trouble breathing).  You are using a reliever medicine more than 2-3 times per week.  Your peak flow is still at 50-79% of your personal best after following your action plan for 1 hour.  You have a fever. SEEK IMMEDIATE MEDICAL CARE IF:   You seem to be getting worse and are unresponsive to treatment during an asthma attack.  You are short of breath even at rest.  You get short of breath when doing very little physical activity.  You have difficulty eating, drinking, or talking due to asthma symptoms.  You develop chest pain.  You develop a fast heartbeat.  You have a bluish color to your lips or  fingernails.  You are light-headed, dizzy, or faint.  Your peak flow is less than 50% of your personal best. MAKE SURE YOU:   Understand these instructions.  Will watch your condition.  Will get help right away if you are not doing well or get worse. Document Released: 01/13/2005 Document Revised: 05/30/2013 Document Reviewed: 08/12/2012 [facility] Patient Information 2015 [employer] [place]. This information is not intended to replace advice given to you by your health care provider. Make sure you discuss any questions you have with your health care provider.

## 2013-11-03 NOTE — ED Provider Notes (Signed)
Medical screening examination/treatment/procedure(s) were performed by non-physician practitioner and as supervising physician I was immediately available for consultation/collaboration.  Toy CookeyMegan Hoyle Barkdull, MD 11/03/13 [phone]

## 2014-03-26 ENCOUNTER — Encounter (HOSPITAL_COMMUNITY): Payer: Self-pay | Admitting: *Deleted

## 2014-03-26 ENCOUNTER — Emergency Department (HOSPITAL_COMMUNITY)
Admission: EM | Admit: 2014-03-26 | Discharge: 2014-03-27 | Disposition: A | Discharge status: Home / Self Care | Payer: Federal, State, Local not specified - PPO | Attending: Emergency Medicine | Admitting: Emergency Medicine

## 2014-03-26 DIAGNOSIS — Z7951 Long term (current) use of inhaled steroids: Secondary | ICD-10-CM | POA: Insufficient documentation

## 2014-03-26 DIAGNOSIS — R42 Dizziness and giddiness: Secondary | ICD-10-CM

## 2014-03-26 DIAGNOSIS — R51 Headache: Secondary | ICD-10-CM | POA: Insufficient documentation

## 2014-03-26 DIAGNOSIS — J321 Chronic frontal sinusitis: Secondary | ICD-10-CM | POA: Diagnosis not present

## 2014-03-26 DIAGNOSIS — J45909 Unspecified asthma, uncomplicated: Secondary | ICD-10-CM | POA: Insufficient documentation

## 2014-03-26 DIAGNOSIS — R519 Headache, unspecified: Secondary | ICD-10-CM

## 2014-03-26 DIAGNOSIS — Z3202 Encounter for pregnancy test, result negative: Secondary | ICD-10-CM | POA: Diagnosis not present

## 2014-03-26 LAB — POC URINE PREG, ED: Preg Test, Ur: NEGATIVE

## 2014-03-26 NOTE — ED Notes (Signed)
Patient presents with c/o headache for about 1 week.  [facility] for her dizziness but does not help with headaches.  Denies increased stress

## 2014-03-27 ENCOUNTER — Emergency Department (HOSPITAL_COMMUNITY): Payer: Federal, State, Local not specified - PPO

## 2014-03-27 MED ORDER — ONDANSETRON 4 MG PO TBDP
4.0000 mg | ORAL_TABLET | Freq: Once | ORAL | Status: AC
  Administered 2014-03-27: 4 mg via ORAL
  Filled 2014-03-27: qty 1

## 2014-03-27 MED ORDER — TRAMADOL HCL 50 MG PO TABS: 50.0000 mg | ORAL_TABLET | Freq: Four times a day (QID) | ORAL | Status: DC | PRN

## 2014-03-27 MED ORDER — AMOXICILLIN 500 MG PO CAPS: 500.0000 mg | ORAL_CAPSULE | Freq: Three times a day (TID) | ORAL | Status: DC

## 2014-03-27 MED ORDER — MORPHINE SULFATE 4 MG/ML IJ SOLN
4.0000 mg | Freq: Once | INTRAMUSCULAR | Status: AC
  Administered 2014-03-27: 4 mg via INTRAMUSCULAR
  Filled 2014-03-27: qty 1

## 2014-03-27 MED ORDER — IBUPROFEN 800 MG PO TABS
800.0000 mg | ORAL_TABLET | Freq: Once | ORAL | Status: AC
  Administered 2014-03-27: 800 mg via ORAL
  Filled 2014-03-27: qty 1

## 2014-03-27 NOTE — ED Notes (Signed)
Patient transported to CT 

## 2014-03-27 NOTE — ED Provider Notes (Signed)
CSN: [id number]     Arrival date & time 03/26/14  2112 History   First MD Initiated Contact with Patient 03/26/14 2332     Chief Complaint  Patient presents with  . Headache     (Consider location/radiation/quality/duration/timing/severity/associated sxs/prior Treatment) HPI   PCP: Pcp Not In System Blood pressure 120/81, pulse 79, temperature 97.9 F (36.6 C), resp. rate 18, height 5\' 3"  (1.6 m), weight 155 lb (70.308 kg), SpO2 99 %.  Sherry Phelps is a 24 y.o.female without any significant PMH presents to the ER with complaints of headache that started 1 week ago. She has also had associated dizziness. She was previously seen at the Urgent care for dizziness and headache and was given Meclizine. She reports no improvement with this. She is able to ambulate and endorses periodically becoming so dizzy she stumbles. She denies ever having a problem with headaches in the past. The headache is mainly frontal and bitemporal. It is a pressure and throbbing in nature.  Pt reports her mother "requests CT scan" and is upset that it was not done at the Urgent Care.  Negative Review of Symptoms: no change of vision, fevers, neck pains, nasal discharge, sore throat, weakness (general or focal), confusion, aphagia, back pain, loss of bowel or urine control, body aches, cough, dysuria, abnormal bleeding.   Past Medical History  Diagnosis Date  . Asthma    History reviewed. No pertinent past surgical history. No family history on file. History  Substance Use Topics  . Smoking status: Never Smoker   . Smokeless tobacco: Never Used  . Alcohol Use: Yes   OB History    No data available     Review of Systems 10 Systems reviewed and are negative for acute change except as noted in the HPI.    Allergies  Review of patient's allergies indicates no known allergies.  Home Medications   Prior to Admission medications   Medication Sig Start Date End Date Taking? Authorizing Provider   Acetaminophen-Caffeine 500-65 MG TABS Take 2 tablets by mouth every 6 (six) hours as needed (headache/migraine).   Yes Historical Provider, MD  albuterol (PROVENTIL HFA;VENTOLIN HFA) 108 (90 BASE) MCG/ACT inhaler Inhale 1-2 puffs into the lungs every 6 (six) hours as needed for wheezing or shortness of breath.   Yes Historical Provider, MD  albuterol (PROVENTIL) (2.5 MG/3ML) 0.083% nebulizer solution Take 2.5 mg by nebulization every 6 (six) hours as needed for wheezing or shortness of breath.    Yes Historical Provider, MD  budesonide-formoterol (SYMBICORT) 160-4.5 MCG/ACT inhaler Inhale 2 puffs into the lungs 2 (two) times daily. 11/02/13  Yes Rodolph BongEvan S Corey, MD  meclizine (ANTIVERT) 25 MG tablet Take 25 mg by mouth 3 (three) times daily as needed for dizziness.   Yes Historical Provider, MD  amoxicillin (AMOXIL) 500 MG capsule Take 1 capsule (500 mg total) by mouth 3 (three) times daily. 03/27/14   Allura Doepke Irine SealG Zaryia Markel, PA-C  predniSONE (DELTASONE) 50 MG tablet Take 1 tablet (50 mg total) by mouth daily. Patient not taking: Reported on 03/26/2014 11/02/13   Rodolph BongEvan S Corey, MD  traMADol (ULTRAM) 50 MG tablet Take 1 tablet (50 mg total) by mouth every 6 (six) hours as needed. 03/27/14   Pearlina Friedly Irine SealG Bernise Sylvain, PA-C   BP 120/81 mmHg  Pulse 79  Temp(Src) 97.9 F (36.6 C)  Resp 18  Ht 5\' 3"  (1.6 m)  Wt 155 lb (70.308 kg)  BMI 27.46 kg/m2  SpO2 99% Physical Exam  Constitutional: She appears  well-developed and well-nourished. No distress.  HENT:  Head: Normocephalic and atraumatic. Head is without raccoon's eyes and without Battle's sign.  Right Ear: Tympanic membrane and ear canal normal.  Left Ear: Tympanic membrane and ear canal normal.  Nose: Right sinus exhibits no maxillary sinus tenderness and no frontal sinus tenderness. Left sinus exhibits no maxillary sinus tenderness and no frontal sinus tenderness.  Mouth/Throat: Uvula is midline and oropharynx is clear and moist.  Eyes: Pupils are equal, round,  and reactive to light.  Neck: Normal range of motion. Neck supple. No spinous process tenderness and no muscular tenderness present.  Cardiovascular: Normal rate and regular rhythm.   Pulmonary/Chest: Effort normal and breath sounds normal. She has no decreased breath sounds. She has no wheezes.  Abdominal: Soft.  Musculoskeletal:  No lower extremity swelling  Neurological: She is alert.  Cranial nerves II-VIII and X-XII evaluated and show no deficits. Pt alert and oriented x 3 Upper and lower extremity strength is symmetrical and physiologic Normal muscular tone No facial droop Coordination intact, no limb ataxia, No pronator drift  Skin: Skin is warm and dry. No rash noted.  Nursing note and vitals reviewed.   ED Course  Procedures (including critical care time) Labs Review Labs Reviewed  POC URINE PREG, ED    Imaging Review No results found.   EKG Interpretation None      MDM   Final diagnoses:  Headache  Dizziness  Frontal sinusitis, unspecified chronicity    I discussed with the patient the risk vs benefit of head CT. I advise against the head CT because I did not feel that it is medically indicated at this time. The patient and her mom however request head CT so will order it.   The head CT shows mild sinusitis, this could be the cause of the patients dizziness and headaches. Will start on abx and have her follow-up with her PCP. Otherwise, the head CT is negative. - neg urine preg. Presentation is non concerning for [facility], ICH, Meningitis, or temporal arteritis. Pt is afebrile with no focal neuro deficits, nuchal rigidity, or change in vision. The patient denies any symptoms of neurological impairment or TIA's; no amaurosis, diplopia, dysphasia, or unilateral disturbance of motor or sensory function. No loss of balance or vertigo.  23 y.o.Sherry Phelps's evaluation in the Emergency Department is complete. It has been determined that no acute conditions requiring  further emergency intervention are present at this time. The patient/guardian have been advised of the diagnosis and plan. We have discussed signs and symptoms that warrant return to the ED, such as changes or worsening in symptoms.  Vital signs are stable at discharge. Filed Vitals:   03/27/14 0150  BP: 120/81  Pulse: 79  Temp:   Resp: 18    Patient/guardian has voiced understanding and agreed to follow-up with the PCP or specialist.     Dorthula Matas, PA-C 03/30/14 1618  Lyanne Co, MD 03/31/14 2302

## 2014-03-27 NOTE — Discharge Instructions (Signed)
Headaches, Frequently Asked Questions MIGRAINE HEADACHES Q: What is migraine? What causes it? How can I treat it? A: Generally, migraine headaches begin as a dull ache. Then they develop into a constant, throbbing, and pulsating pain. You may experience pain at the temples. You may experience pain at the front or back of one or both sides of the head. The pain is usually accompanied by a combination of:  Nausea.  Vomiting.  Sensitivity to light and noise. Some people (about 15%) experience an aura (see below) before an attack. The cause of migraine is believed to be chemical reactions in the brain. Treatment for migraine may include over-the-counter or prescription medications. It may also include self-help techniques. These include relaxation training and biofeedback.  Q: What is an aura? A: About 15% of people with migraine get an "aura". This is a sign of neurological symptoms that occur before a migraine headache. You may see wavy or jagged lines, dots, or flashing lights. You might experience tunnel vision or blind spots in one or both eyes. The aura can include visual or auditory hallucinations (something imagined). It may include disruptions in smell (such as strange odors), taste or touch. Other symptoms include:  Numbness.  A "pins and needles" sensation.  Difficulty in recalling or speaking the correct word. These neurological events may last as long as 60 minutes. These symptoms will fade as the headache begins. Q: What is a trigger? A: Certain physical or environmental factors can lead to or "trigger" a migraine. These include:  Foods.  Hormonal changes.  Weather.  Stress. It is important to remember that triggers are different for everyone. To help prevent migraine attacks, you need to figure out which triggers affect you. Keep a headache diary. This is a good way to track triggers. The diary will help you talk to your healthcare professional about your condition. Q:  Does weather affect migraines? A: Bright sunshine, hot, humid conditions, and drastic changes in barometric pressure may lead to, or "trigger," a migraine attack in some people. But studies have shown that weather does not act as a trigger for everyone with migraines. Q: What is the link between migraine and hormones? A: Hormones start and regulate many of your body's functions. Hormones keep your body in balance within a constantly changing environment. The levels of hormones in your body are unbalanced at times. Examples are during menstruation, pregnancy, or menopause. That can lead to a migraine attack. In fact, about three quarters of all women with migraine report that their attacks are related to the menstrual cycle.  Q: Is there an increased risk of stroke for migraine sufferers? A: The likelihood of a migraine attack causing a stroke is very remote. That is not to say that migraine sufferers cannot have a stroke associated with their migraines. In persons under age 22, the most common associated factor for stroke is migraine headache. But over the course of a person's normal life span, the occurrence of migraine headache may actually be associated with a reduced risk of dying from cerebrovascular disease due to stroke.  Q: What are acute medications for migraine? A: Acute medications are used to treat the pain of the headache after it has started. Examples over-the-counter medications, NSAIDs, ergots, and triptans.  Q: What are the triptans? A: Triptans are the newest class of abortive medications. They are specifically targeted to treat migraine. Triptans are vasoconstrictors. They moderate some chemical reactions in the brain. The triptans work on receptors in your brain. Triptans help  to restore the balance of a neurotransmitter called serotonin. Fluctuations in levels of serotonin are thought to be a main cause of migraine.  °Q: Are over-the-counter medications for migraine effective? °A:  Over-the-counter, or "OTC," medications may be effective in relieving mild to moderate pain and associated symptoms of migraine. But you should see your caregiver before beginning any treatment regimen for migraine.  °Q: What are preventive medications for migraine? °A: Preventive medications for migraine are sometimes referred to as "prophylactic" treatments. They are used to reduce the frequency, severity, and length of migraine attacks. Examples of preventive medications include antiepileptic medications, antidepressants, beta-blockers, calcium channel blockers, and NSAIDs (nonsteroidal anti-inflammatory drugs). °Q: Why are anticonvulsants used to treat migraine? °A: During the past few years, there has been an increased interest in antiepileptic drugs for the prevention of migraine. They are sometimes referred to as "anticonvulsants". Both epilepsy and migraine may be caused by similar reactions in the brain.  °Q: Why are antidepressants used to treat migraine? °A: Antidepressants are typically used to treat people with depression. They may reduce migraine frequency by regulating chemical levels, such as serotonin, in the brain.  °Q: What alternative therapies are used to treat migraine? °A: The term "alternative therapies" is often used to describe treatments considered outside the scope of conventional Western medicine. Examples of alternative therapy include acupuncture, acupressure, and yoga. Another common alternative treatment is herbal therapy. Some herbs are believed to relieve headache pain. Always discuss alternative therapies with your caregiver before proceeding. Some herbal products contain arsenic and other toxins. °TENSION HEADACHES °Q: What is a tension-type headache? What causes it? How can I treat it? °A: Tension-type headaches occur randomly. They are often the result of temporary stress, anxiety, fatigue, or anger. Symptoms include soreness in your temples, a tightening band-like sensation  around your head (a "vice-like" ache). Symptoms can also include a pulling feeling, pressure sensations, and contracting head and neck muscles. The headache begins in your forehead, temples, or the back of your head and neck. Treatment for tension-type headache may include over-the-counter or prescription medications. Treatment may also include self-help techniques such as relaxation training and biofeedback. °CLUSTER HEADACHES °Q: What is a cluster headache? What causes it? How can I treat it? °A: Cluster headache gets its name because the attacks come in groups. The pain arrives with little, if any, warning. It is usually on one side of the head. A tearing or bloodshot eye and a runny nose on the same side of the headache may also accompany the pain. Cluster headaches are believed to be caused by chemical reactions in the brain. They have been described as the most severe and intense of any headache type. Treatment for cluster headache includes prescription medication and oxygen. °SINUS HEADACHES °Q: What is a sinus headache? What causes it? How can I treat it? °A: When a cavity in the bones of the face and skull (a sinus) becomes inflamed, the inflammation will cause localized pain. This condition is usually the result of an allergic reaction, a tumor, or an infection. If your headache is caused by a sinus blockage, such as an infection, you will probably have a fever. An x-ray will confirm a sinus blockage. Your caregiver's treatment might include antibiotics for the infection, as well as antihistamines or decongestants.  °REBOUND HEADACHES °Q: What is a rebound headache? What causes it? How can I treat it? °A: A pattern of taking acute headache medications too often can lead to a condition known as "rebound headache."   A pattern of taking too much headache medication includes taking it more than 2 days per week or in excessive amounts. That means more than the label or a caregiver advises. With rebound  headaches, your medications not only stop relieving pain, they actually begin to cause headaches. Doctors treat rebound headache by tapering the medication that is being overused. Sometimes your caregiver will gradually substitute a different type of treatment or medication. Stopping may be a challenge. Regularly overusing a medication increases the potential for serious side effects. Consult a caregiver if you regularly use headache medications more than 2 days per week or more than the label advises. ADDITIONAL QUESTIONS AND ANSWERS Q: What is biofeedback? A: Biofeedback is a self-help treatment. Biofeedback uses special equipment to monitor your body's involuntary physical responses. Biofeedback monitors:  Breathing.  Pulse.  Heart rate.  Temperature.  Muscle tension.  Brain activity. Biofeedback helps you refine and perfect your relaxation exercises. You learn to control the physical responses that are related to stress. Once the technique has been mastered, you do not need the equipment any more. Q: Are headaches hereditary? A: Four out of five (80%) of people that suffer report a family history of migraine. Scientists are not sure if this is genetic or a family predisposition. Despite the uncertainty, a child has a 50% chance of having migraine if one parent suffers. The child has a 75% chance if both parents suffer.  Q: Can children get headaches? A: By the time they reach high school, most young people have experienced some type of headache. Many safe and effective approaches or medications can prevent a headache from occurring or stop it after it has begun.  Q: What type of doctor should I see to diagnose and treat my headache? A: Start with your primary caregiver. Discuss his or her experience and approach to headaches. Discuss methods of classification, diagnosis, and treatment. Your caregiver may decide to recommend you to a headache specialist, depending upon your symptoms or other  physical conditions. Having diabetes, allergies, etc., may require a more comprehensive and inclusive approach to your headache. The National Headache Foundation will provide, upon request, a list of [facility] physician members in your state. Document Released: 04/05/2003 Document Revised: 04/07/2011 Document Reviewed: 09/13/2007 [facility] Patient Information 2015 [place], [place]. This information is not intended to replace advice given to you by your health care provider. Make sure you discuss any questions you have with your health care provider. Vertigo Vertigo means you feel like you or your surroundings are moving when they are not. Vertigo can be dangerous if it occurs when you are at work, driving, or performing difficult activities.  CAUSES  Vertigo occurs when there is a conflict of signals sent to your brain from the visual and sensory systems in your body. There are many different causes of vertigo, including:  Infections, especially in the inner ear.  A bad reaction to a drug or misuse of alcohol and medicines.  Withdrawal from drugs or alcohol.  Rapidly changing positions, such as lying down or rolling over in bed.  A migraine headache.  Decreased blood flow to the brain.  Increased pressure in the brain from a head injury, infection, tumor, or bleeding. SYMPTOMS  You may feel as though the world is spinning around or you are falling to the ground. Because your balance is upset, vertigo can cause nausea and vomiting. You may have involuntary eye movements (nystagmus). DIAGNOSIS  Vertigo is usually diagnosed by physical exam. If the cause  of your vertigo is unknown, your caregiver may perform imaging tests, such as an MRI scan (magnetic resonance imaging). TREATMENT  Most cases of vertigo resolve on their own, without treatment. Depending on the cause, your caregiver may prescribe certain medicines. If your vertigo is related to body position issues, your caregiver may recommend  movements or procedures to correct the problem. In rare cases, if your vertigo is caused by certain inner ear problems, you may need surgery. HOME CARE INSTRUCTIONS   Follow your caregiver's instructions.  Avoid driving.  Avoid operating heavy machinery.  Avoid performing any tasks that would be dangerous to you or others during a vertigo episode.  Tell your caregiver if you notice that certain medicines seem to be causing your vertigo. Some of the medicines used to treat vertigo episodes can actually make them worse in some people. SEEK IMMEDIATE MEDICAL CARE IF:   Your medicines do not relieve your vertigo or are making it worse.  You develop problems with talking, walking, weakness, or using your arms, hands, or legs.  You develop severe headaches.  Your nausea or vomiting continues or gets worse.  You develop visual changes.  A family member notices behavioral changes.  Your condition gets worse. MAKE SURE YOU:  Understand these instructions.  Will watch your condition.  Will get help right away if you are not doing well or get worse. Document Released: 10/23/2004 Document Revised: 04/07/2011 Document Reviewed: 08/01/2010 [facility] Patient Information 2015 [place]. This information is not intended to replace advice given to you by your health care provider. Make sure you discuss any questions you have with your health care provider.

## 2014-11-18 ENCOUNTER — Encounter (HOSPITAL_COMMUNITY): Payer: Self-pay

## 2014-11-18 ENCOUNTER — Emergency Department (HOSPITAL_COMMUNITY)
Admission: EM | Admit: 2014-11-18 | Discharge: 2014-11-19 | Disposition: A | Discharge status: Home / Self Care | Payer: Federal, State, Local not specified - PPO | Attending: Emergency Medicine | Admitting: Emergency Medicine

## 2014-11-18 DIAGNOSIS — Z7951 Long term (current) use of inhaled steroids: Secondary | ICD-10-CM | POA: Insufficient documentation

## 2014-11-18 DIAGNOSIS — Z79899 Other long term (current) drug therapy: Secondary | ICD-10-CM | POA: Diagnosis not present

## 2014-11-18 DIAGNOSIS — R51 Headache: Secondary | ICD-10-CM | POA: Diagnosis not present

## 2014-11-18 DIAGNOSIS — H53149 Visual discomfort, unspecified: Secondary | ICD-10-CM | POA: Diagnosis not present

## 2014-11-18 DIAGNOSIS — Z3202 Encounter for pregnancy test, result negative: Secondary | ICD-10-CM | POA: Insufficient documentation

## 2014-11-18 DIAGNOSIS — J45909 Unspecified asthma, uncomplicated: Secondary | ICD-10-CM | POA: Diagnosis not present

## 2014-11-18 DIAGNOSIS — Z792 Long term (current) use of antibiotics: Secondary | ICD-10-CM | POA: Diagnosis not present

## 2014-11-18 DIAGNOSIS — R519 Headache, unspecified: Secondary | ICD-10-CM

## 2014-11-18 MED ORDER — SODIUM CHLORIDE 0.9 % IV SOLN
1000.0000 mL | INTRAVENOUS | Status: DC
  Administered 2014-11-18: 1000 mL via INTRAVENOUS

## 2014-11-18 MED ORDER — DIPHENHYDRAMINE HCL 50 MG/ML IJ SOLN
25.0000 mg | Freq: Once | INTRAMUSCULAR | Status: AC
  Administered 2014-11-19: 25 mg via INTRAVENOUS
  Filled 2014-11-18: qty 1

## 2014-11-18 MED ORDER — METOCLOPRAMIDE HCL 5 MG/ML IJ SOLN
10.0000 mg | Freq: Once | INTRAMUSCULAR | Status: AC
  Administered 2014-11-19: 10 mg via INTRAVENOUS
  Filled 2014-11-18: qty 2

## 2014-11-18 MED ORDER — KETOROLAC TROMETHAMINE 30 MG/ML IJ SOLN
30.0000 mg | Freq: Once | INTRAMUSCULAR | Status: AC
  Administered 2014-11-19: 30 mg via INTRAVENOUS
  Filled 2014-11-18: qty 1

## 2014-11-18 MED ORDER — SODIUM CHLORIDE 0.9 % IV SOLN
1000.0000 mL | Freq: Once | INTRAVENOUS | Status: AC
  Administered 2014-11-18: 1000 mL via INTRAVENOUS

## 2014-11-18 NOTE — ED Notes (Signed)
Pt states she has been experiencing a HA intermittently off and on for a month. Todays HA is associated with light sensitivity and "lumps on the center of my head and it hurts when I touch them." Denies N/V/blurry vision.

## 2014-11-18 NOTE — ED Provider Notes (Signed)
CSN: [id number]     Arrival date & time 11/18/14  2259 History  By signing my name below, I, Phillis HaggisGabriella Gaje, attest that this documentation has been prepared under the direction and in the presence of Dione Boozeavid Chou Busler, MD. Electronically Signed: Phillis HaggisGabriella Gaje, ED Scribe. 11/18/2014. 12:43 AM.  Chief Complaint  Patient presents with  . Headache   The history is provided by the patient. No language interpreter was used.  HPI Comments: Sherry Loftyrin Phelps is a 24 y.o. female who presents to the Emergency Department complaining of sharp, throbbing, intermittent, gradually worsening headache onset earlier today. She states that she has been having headaches on and off for the past couple of months but it worsened today. She reports that she has "lumps" going to the back of her head that are tender and will worsen her headache when palpated. She reports that her headaches will occasionally wake her from sleep. Pt reports that her headaches can last up to an entire day. She states that her pain is currently 5/10 and is 10/10 at its worst. She reports that light and sound will occasionally worsen the pain and taking Excedrin will bring temporary relief until the medication wears off. She states that she has been seen at Urgent Care for this problem and has been referred to a headache specialist; she states that her appointment is not until December. She denies blurred vision, nausea, vomiting, numbness or weakness. She denies chance of pregnancy.  Past Medical History  Diagnosis Date  . Asthma    History reviewed. No pertinent past surgical history. No family history on file. Social History  Substance Use Topics  . Smoking status: Never Smoker   . Smokeless tobacco: Never Used  . Alcohol Use: Yes   OB History    No data available     Review of Systems  Eyes: Positive for photophobia. Negative for visual disturbance.  Gastrointestinal: Negative for nausea and vomiting.  Neurological: Positive for  headaches. Negative for weakness and numbness.  All other systems reviewed and are negative.  Allergies  Review of patient's allergies indicates no known allergies.  Home Medications   Prior to Admission medications   Medication Sig Start Date End Date Taking? Authorizing Provider  Acetaminophen-Caffeine 500-65 MG TABS Take 2 tablets by mouth every 6 (six) hours as needed (headache/migraine).    Historical Provider, MD  albuterol (PROVENTIL HFA;VENTOLIN HFA) 108 (90 BASE) MCG/ACT inhaler Inhale 1-2 puffs into the lungs every 6 (six) hours as needed for wheezing or shortness of breath.    Historical Provider, MD  albuterol (PROVENTIL) (2.5 MG/3ML) 0.083% nebulizer solution Take 2.5 mg by nebulization every 6 (six) hours as needed for wheezing or shortness of breath.     Historical Provider, MD  amoxicillin (AMOXIL) 500 MG capsule Take 1 capsule (500 mg total) by mouth 3 (three) times daily. 03/27/14   Tiffany Neva SeatGreene, PA-C  budesonide-formoterol (SYMBICORT) 160-4.5 MCG/ACT inhaler Inhale 2 puffs into the lungs 2 (two) times daily. 11/02/13   Rodolph BongEvan S Corey, MD  meclizine (ANTIVERT) 25 MG tablet Take 25 mg by mouth 3 (three) times daily as needed for dizziness.    Historical Provider, MD  predniSONE (DELTASONE) 50 MG tablet Take 1 tablet (50 mg total) by mouth daily. Patient not taking: Reported on 03/26/2014 11/02/13   Rodolph BongEvan S Corey, MD  traMADol (ULTRAM) 50 MG tablet Take 1 tablet (50 mg total) by mouth every 6 (six) hours as needed. 03/27/14   Tiffany Neva SeatGreene, PA-C   BP 131/80  mmHg  Pulse 88  Temp(Src) 98.2 F (36.8 C) (Oral)  Resp 18  Ht  (1.626 m)  Wt 146 lb (66.225 kg)  BMI 25.05 kg/m2  SpO2 100%  LMP 11/06/2014  Physical Exam  Constitutional: She is oriented to person, place, and time. She appears well-developed and well-nourished.  HENT:  Head: Normocephalic and atraumatic.  Tenderness over the vertex and occiput without any nodules or scalp lesions seen; tender insertion of the  right paracervical muscles  Eyes: EOM are normal. Pupils are equal, round, and reactive to light.  Normal fundi  Neck: Normal range of motion. Neck supple. No JVD present.  Cardiovascular: Normal rate, regular rhythm and normal heart sounds.  Exam reveals no gallop and no friction rub.   No murmur heard. Pulmonary/Chest: Effort normal and breath sounds normal. She has no wheezes. She has no rales. She exhibits no tenderness.  Abdominal: Soft. Bowel sounds are normal. She exhibits no distension and no mass. There is no tenderness.  Musculoskeletal: Normal range of motion. She exhibits no edema.  Lymphadenopathy:    She has no cervical adenopathy.  Neurological: She is alert and oriented to person, place, and time. No cranial nerve deficit. She exhibits normal muscle tone. Coordination normal.  Skin: Skin is warm and dry. No rash noted.  Psychiatric: She has a normal mood and affect. Her behavior is normal. Judgment and thought content normal.  Nursing note and vitals reviewed.   ED Course  Procedures (including critical care time) DIAGNOSTIC STUDIES: Oxygen Saturation is 100% on RA, normal by my interpretation.    COORDINATION OF CARE: 11:39 PM-Discussed treatment plan which includes labs, IV fluids, and CT scan with pt at bedside and pt agreed to plan.   Labs Review Results for orders placed or performed during the hospital encounter of 11/18/14  CBC with Differential  Result Value Ref Range   WBC 6.6 4.0 - 10.5 K/uL   RBC 4.26 3.87 - 5.11 MIL/uL   Hemoglobin 12.9 12.0 - 15.0 g/dL   HCT 11.9 14.7 - 82.9 %   MCV 86.6 78.0 - 100.0 fL   MCH 30.3 26.0 - 34.0 pg   MCHC 35.0 30.0 - 36.0 g/dL   RDW 56.2 13.0 - 86.5 %   Platelets 252 150 - 400 K/uL   Neutrophils Relative % 38 %   Neutro Abs 2.5 1.7 - 7.7 K/uL   Lymphocytes Relative 49 %   Lymphs Abs 3.3 0.7 - 4.0 K/uL   Monocytes Relative 8 %   Monocytes Absolute 0.6 0.1 - 1.0 K/uL   Eosinophils Relative 4 %   Eosinophils  Absolute 0.2 0.0 - 0.7 K/uL   Basophils Relative 1 %   Basophils Absolute 0.0 0.0 - 0.1 K/uL  Basic metabolic panel  Result Value Ref Range   Sodium 137 135 - 145 mmol/L   Potassium 4.1 3.5 - 5.1 mmol/L   Chloride 106 101 - 111 mmol/L   CO2 25 22 - 32 mmol/L   Glucose, Bld 96 65 - 99 mg/dL   BUN 16 6 - 20 mg/dL   Creatinine, Ser 7.84 0.44 - 1.00 mg/dL   Calcium 9.2 8.9 - 69.6 mg/dL   GFR calc non Af Amer >60 >60 mL/min   GFR calc Af Amer >60 >60 mL/min   Anion gap 6 5 - 15  I-Stat beta hCG blood, ED  Result Value Ref Range   I-stat hCG, quantitative <5.0 <5 mIU/mL   Comment 3  Imaging Review Ct Head Wo Contrast  11/19/2014  CLINICAL DATA:  24 year old female with subacute headaches for several months. EXAM: CT HEAD WITHOUT CONTRAST TECHNIQUE: Contiguous axial images were obtained from the base of the skull through the vertex without intravenous contrast. COMPARISON:  03/27/2014 FINDINGS: No intracranial abnormalities are identified, including mass lesion or mass effect, hydrocephalus, extra-axial fluid collection, midline shift, hemorrhage, or acute infarction. The visualized bony calvarium is unremarkable. IMPRESSION: Unremarkable noncontrast head CT. Electronically Signed   By: Harmon Pier M.D.   On: 11/19/2014 00:14   I have personally reviewed and evaluated these images and lab results as part of my medical decision-making.  MDM   Final diagnoses:  Headache, unspecified headache type    Headache in pattern suggestive of muscle contraction headache. Patient complaints of lesions on scalp which I cannot identify. She'll be given a headache cocktail and sent for CT of the head. Of note, she does have an appointment with a headache clinic but not for another 6 weeks.  She had good relief of her headache with a headache cocktail. He ED workup is unremarkable. She is discharged with a prescription for metoclopramide and is advised to keep her appointment with the headache  clinic.  I, Jeven Topper, personally performed the services described in this documentation. All medical record entries made by the scribe were at my direction and in my presence.  I have reviewed the chart and discharge instructions and agree that the record reflects my personal performance and is accurate and complete. Keela Rubert.  11/19/2014. 12:16 AM.       Dione Booze, MD 11/19/14 [phone]

## 2014-11-19 ENCOUNTER — Emergency Department (HOSPITAL_COMMUNITY): Payer: Federal, State, Local not specified - PPO

## 2014-11-19 LAB — CBC WITH DIFFERENTIAL/PLATELET
BASOS ABS: 0 10*3/uL (ref 0.0–0.1)
BASOS PCT: 1 %
Eosinophils Absolute: 0.2 10*3/uL (ref 0.0–0.7)
Eosinophils Relative: 4 %
HCT: 36.9 % (ref 36.0–46.0)
Hemoglobin: 12.9 g/dL (ref 12.0–15.0)
LYMPHS ABS: 3.3 10*3/uL (ref 0.7–4.0)
Lymphocytes Relative: 49 %
MCH: 30.3 pg (ref 26.0–34.0)
MCHC: 35 g/dL (ref 30.0–36.0)
MCV: 86.6 fL (ref 78.0–100.0)
Monocytes Absolute: 0.6 10*3/uL (ref 0.1–1.0)
Monocytes Relative: 8 %
NEUTROS ABS: 2.5 10*3/uL (ref 1.7–7.7)
Neutrophils Relative %: 38 %
PLATELETS: 252 10*3/uL (ref 150–400)
RBC: 4.26 MIL/uL (ref 3.87–5.11)
RDW: 13 % (ref 11.5–15.5)
WBC: 6.6 10*3/uL (ref 4.0–10.5)

## 2014-11-19 LAB — I-STAT BETA HCG BLOOD, ED (MC, WL, AP ONLY): I-stat hCG, quantitative: <5 m[IU]/mL (ref <5)

## 2014-11-19 LAB — BASIC METABOLIC PANEL
ANION GAP: 6 (ref 5–15)
BUN: 16 mg/dL (ref 6–20)
CALCIUM: 9.2 mg/dL (ref 8.9–10.3)
CO2: 25 mmol/L (ref 22–32)
Chloride: 106 mmol/L (ref 101–111)
Creatinine, Ser: 0.78 mg/dL (ref 0.44–1.00)
GFR calc non Af Amer: >60 mL/min (ref >60)
Glucose, Bld: 96 mg/dL (ref 65–99)
Potassium: 4.1 mmol/L (ref 3.5–5.1)
Sodium: 137 mmol/L (ref 135–145)

## 2014-11-19 MED ORDER — METOCLOPRAMIDE HCL 10 MG PO TABS: 10.0000 mg | ORAL_TABLET | Freq: Four times a day (QID) | ORAL | Status: AC | PRN

## 2014-11-19 NOTE — Discharge Instructions (Signed)
General Headache Without Cause °A headache is pain or discomfort felt around the head or neck area. The specific cause of a headache may not be found. There are many causes and types of headaches. A few common ones are: °· Tension headaches. °· Migraine headaches. °· Cluster headaches. °· Chronic daily headaches. °HOME CARE INSTRUCTIONS  °Watch your condition for any changes. Take these steps to help with your condition: °Managing Pain °· Take over-the-counter and prescription medicines only as told by your health care provider. °· Lie down in a dark, quiet room when you have a headache. °· If directed, apply ice to the head and neck area: °¨ Put ice in a plastic bag. °¨ Place a towel between your skin and the bag. °¨ Leave the ice on for 20 minutes, 2-3 times per day. °· Use a heating pad or hot shower to apply heat to the head and neck area as told by your health care provider. °· Keep lights dim if bright lights bother you or make your headaches worse. °Eating and Drinking °· Eat meals on a regular schedule. °· Limit alcohol use. °· Decrease the amount of caffeine you drink, or stop drinking caffeine. °General Instructions °· Keep all follow-up visits as told by your health care provider. This is important. °· Keep a headache journal to help find out what may trigger your headaches. For example, write down: °¨ What you eat and drink. °¨ How much sleep you get. °¨ Any change to your diet or medicines. °· Try massage or other relaxation techniques. °· Limit stress. °· Sit up straight, and do not tense your muscles. °· Do not use tobacco products, including cigarettes, chewing tobacco, or e-cigarettes. If you need help quitting, ask your health care provider. °· Exercise regularly as told by your health care provider. °· Sleep on a regular schedule. Get 7-9 hours of sleep, or the amount recommended by your health care provider. °SEEK MEDICAL CARE IF:  °· Your symptoms are not helped by medicine. °· You have a  headache that is different from the usual headache. °· You have nausea or you vomit. °· You have a fever. °SEEK IMMEDIATE MEDICAL CARE IF:  °· Your headache becomes severe. °· You have repeated vomiting. °· You have a stiff neck. °· You have a loss of vision. °· You have problems with speech. °· You have pain in the eye or ear. °· You have muscular weakness or loss of muscle control. °· You lose your balance or have trouble walking. °· You feel faint or pass out. °· You have confusion. °  °This information is not intended to replace advice given to you by your health care provider. Make sure you discuss any questions you have with your health care provider. °  °Document Released: 01/13/2005 Document Revised: 10/04/2014 Document Reviewed: 05/08/2014 °Elsevier Interactive Patient Education ©2016 [employer] ° °Metoclopramide tablets °What is this medicine? °METOCLOPRAMIDE (met oh kloe PRA mide) is used to treat the symptoms of gastroesophageal reflux disease (GERD) like heartburn. It is also used to treat people with slow emptying of the stomach and intestinal tract. °This medicine may be used for other purposes; ask your health care provider or pharmacist if you have questions. °What should I tell my health care provider before I take this medicine? °They need to know if you have any of these conditions: °-breast cancer °-depression °-diabetes °-heart failure °-high blood pressure °-kidney disease °-liver disease °-Parkinson's disease or a movement disorder °-pheochromocytoma °-seizures °-stomach obstruction,   bleeding, or perforation °-an unusual or allergic reaction to metoclopramide, procainamide, sulfites, other medicines, foods, dyes, or preservatives °-pregnant or trying to get pregnant °-breast-feeding °How should I use this medicine? °Take this medicine by mouth with a glass of water. Follow the directions on the prescription label. Take this medicine on an empty stomach, about 30 minutes before eating. Take  your doses at regular intervals. Do not take your medicine more often than directed. Do not stop taking except on the advice of your doctor or health care professional. °A special MedGuide will be given to you by the pharmacist with each prescription and refill. Be sure to read this information carefully each time. °Talk to your pediatrician regarding the use of this medicine in children. Special care may be needed. °Overdosage: If you think you have taken too much of this medicine contact a poison control center or emergency room at once. °NOTE: This medicine is only for you. Do not share this medicine with others. °What if I miss a dose? °If you miss a dose, take it as soon as you can. If it is almost time for your next dose, take only that dose. Do not take double or extra doses. °What may interact with this medicine? °-acetaminophen °-cyclosporine °-digoxin °-medicines for blood pressure °-medicines for diabetes, including insulin °-medicines for hay fever and other allergies °-medicines for depression, especially an Monoamine Oxidase Inhibitor (MAOI) °-medicines for Parkinson's disease, like levodopa °-medicines for sleep or for pain °-tetracycline °This list may not describe all possible interactions. Give your health care provider a list of all the medicines, herbs, non-prescription drugs, or dietary supplements you use. Also tell them if you smoke, drink alcohol, or use illegal drugs. Some items may interact with your medicine. °What should I watch for while using this medicine? °It may take a few weeks for your stomach condition to start to get better. However, do not take this medicine for longer than 12 weeks. The longer you take this medicine, and the more you take it, the greater your chances are of developing serious side effects. If you are an elderly patient, a female patient, or you have diabetes, you may be at an increased risk for side effects from this medicine. Contact your doctor immediately if  you start having movements you cannot control such as lip smacking, rapid movements of the tongue, involuntary or uncontrollable movements of the eyes, head, arms and legs, or muscle twitches and spasms. °Patients and their families should watch out for worsening depression or thoughts of suicide. Also watch out for any sudden or severe changes in feelings such as feeling anxious, agitated, panicky, irritable, hostile, aggressive, impulsive, severely restless, overly excited and hyperactive, or not being able to sleep. If this happens, especially at the beginning of treatment or after a change in dose, call your doctor. °Do not treat yourself for high fever. Ask your doctor or health care professional for advice. °You may get drowsy or dizzy. Do not drive, use machinery, or do anything that needs mental alertness until you know how this drug affects you. Do not stand or sit up quickly, especially if you are an older patient. This reduces the risk of dizzy or fainting spells. Alcohol can make you more drowsy and dizzy. Avoid alcoholic drinks. °What side effects may I notice from receiving this medicine? °Side effects that you should report to your doctor or health care professional as soon as possible: °-allergic reactions like skin rash, itching or hives, swelling of the   face, lips, or tongue -abnormal production of milk in females -breast enlargement in both males and females -change in the way you walk -difficulty moving, speaking or swallowing -drooling, lip smacking, or rapid movements of the tongue -excessive sweating -fever -involuntary or uncontrollable movements of the eyes, head, arms and legs -irregular heartbeat or palpitations -muscle twitches and spasms -unusually weak or tired Side effects that usually do not require medical attention (report to your doctor or health care professional if they continue or are bothersome): -change in sex drive or performance -depressed  mood -diarrhea -difficulty sleeping -headache -menstrual changes -restless or nervous This list may not describe all possible side effects. Call your doctor for medical advice about side effects. You may report side effects to FDA at 1-800-FDA-1088. Where should I keep my medicine? Keep out of the reach of children. Store at room temperature between 20 and 25 degrees C (68 and 77 degrees F). Protect from light. Keep container tightly closed. Throw away any unused medicine after the expiration date. NOTE: This sheet is a summary. It may not cover all possible information. If you have questions about this medicine, talk to your doctor, pharmacist, or health care provider.    2016, Elsevier/[employer] Standard. (2011-05-13 13:04:38)

## 2014-12-27 ENCOUNTER — Encounter (HOSPITAL_COMMUNITY): Payer: Self-pay | Admitting: Emergency Medicine

## 2014-12-27 ENCOUNTER — Emergency Department (HOSPITAL_COMMUNITY)
Admission: EM | Admit: 2014-12-27 | Discharge: 2014-12-27 | Disposition: A | Discharge status: Home / Self Care | Payer: Federal, State, Local not specified - PPO | Attending: Emergency Medicine | Admitting: Emergency Medicine

## 2014-12-27 DIAGNOSIS — Z23 Encounter for immunization: Secondary | ICD-10-CM | POA: Insufficient documentation

## 2014-12-27 DIAGNOSIS — Y9389 Activity, other specified: Secondary | ICD-10-CM | POA: Diagnosis not present

## 2014-12-27 DIAGNOSIS — Z79899 Other long term (current) drug therapy: Secondary | ICD-10-CM | POA: Insufficient documentation

## 2014-12-27 DIAGNOSIS — Y9289 Other specified places as the place of occurrence of the external cause: Secondary | ICD-10-CM | POA: Diagnosis not present

## 2014-12-27 DIAGNOSIS — J45909 Unspecified asthma, uncomplicated: Secondary | ICD-10-CM | POA: Insufficient documentation

## 2014-12-27 DIAGNOSIS — S0990XA Unspecified injury of head, initial encounter: Secondary | ICD-10-CM | POA: Diagnosis present

## 2014-12-27 DIAGNOSIS — S0181XA Laceration without foreign body of other part of head, initial encounter: Secondary | ICD-10-CM | POA: Diagnosis not present

## 2014-12-27 DIAGNOSIS — Y998 Other external cause status: Secondary | ICD-10-CM | POA: Insufficient documentation

## 2014-12-27 DIAGNOSIS — Z7951 Long term (current) use of inhaled steroids: Secondary | ICD-10-CM | POA: Diagnosis not present

## 2014-12-27 MED ORDER — HYDROCODONE-ACETAMINOPHEN 5-325 MG PO TABS
2.0000 | ORAL_TABLET | Freq: Once | ORAL | Status: AC
  Administered 2014-12-27: 2 via ORAL
  Filled 2014-12-27: qty 2

## 2014-12-27 MED ORDER — LIDOCAINE-EPINEPHRINE (PF) 2 %-1:200000 IJ SOLN
10.0000 mL | Freq: Once | INTRAMUSCULAR | Status: AC
  Administered 2014-12-27: 10 mL
  Filled 2014-12-27: qty 20

## 2014-12-27 MED ORDER — BACITRACIN ZINC 500 UNIT/GM EX OINT: 1.0000 "application " | TOPICAL_OINTMENT | Freq: Two times a day (BID) | CUTANEOUS | Status: AC

## 2014-12-27 MED ORDER — TETANUS-DIPHTH-ACELL PERTUSSIS 5-2.5-18.5 LF-MCG/0.5 IM SUSP
0.5000 mL | Freq: Once | INTRAMUSCULAR | Status: AC
  Administered 2014-12-27: 0.5 mL via INTRAMUSCULAR
  Filled 2014-12-27: qty 0.5
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
## 2014-12-27 NOTE — Discharge Instructions (Signed)
Facial Laceration ° A facial laceration is a cut on the face. These injuries can be painful and cause bleeding. Lacerations usually heal quickly, but they need special care to reduce scarring. °DIAGNOSIS  °Your health care provider will take a medical history, ask for details about how the injury occurred, and examine the wound to determine how deep the cut is. °TREATMENT  °Some facial lacerations may not require closure. Others may not be able to be closed because of an increased risk of infection. The risk of infection and the chance for successful closure will depend on various factors, including the amount of time since the injury occurred. °The wound may be cleaned to help prevent infection. If closure is appropriate, pain medicines may be given if needed. Your health care provider will use stitches (sutures), wound glue (adhesive), or skin adhesive strips to repair the laceration. These tools bring the skin edges together to allow for faster healing and a better cosmetic outcome. If needed, you may also be given a tetanus shot. °HOME CARE INSTRUCTIONS °· Only take over-the-counter or prescription medicines as directed by your health care provider. °· Follow your health care provider's instructions for wound care. These instructions will vary depending on the technique used for closing the wound. °For Sutures: °· Keep the wound clean and dry.   °· If you were given a bandage (dressing), you should change it at least once a day. Also change the dressing if it becomes wet or dirty, or as directed by your health care provider.   °· Wash the wound with soap and water 2 times a day. Rinse the wound off with water to remove all soap. Pat the wound dry with a clean towel.   °· After cleaning, apply a thin layer of the antibiotic ointment recommended by your health care provider. This will help prevent infection and keep the dressing from sticking.   °· You may shower as usual after the first 24 hours. Do not soak the  wound in water until the sutures are removed.   °· Get your sutures removed as directed by your health care provider. With facial lacerations, sutures should usually be taken out after 4-5 days to avoid stitch marks.   °· Wait a few days after your sutures are removed before applying any makeup. °For Skin Adhesive Strips: °· Keep the wound clean and dry.   °· Do not get the skin adhesive strips wet. You may bathe carefully, using caution to keep the wound dry.   °· If the wound gets wet, pat it dry with a clean towel.   °· Skin adhesive strips will fall off on their own. You may trim the strips as the wound heals. Do not remove skin adhesive strips that are still stuck to the wound. They will fall off in time.   °For Wound Adhesive: °· You may briefly wet your wound in the shower or bath. Do not soak or scrub the wound. Do not swim. Avoid periods of heavy sweating until the skin adhesive has fallen off on its own. After showering or bathing, gently pat the wound dry with a clean towel.   °· Do not apply liquid medicine, cream medicine, ointment medicine, or makeup to your wound while the skin adhesive is in place. This may loosen the film before your wound is healed.   °· If a dressing is placed over the wound, be careful not to apply tape directly over the skin adhesive. This may cause the adhesive to be pulled off before the wound is healed.   °· Avoid   prolonged exposure to sunlight or tanning lamps while the skin adhesive is in place.  The skin adhesive will usually remain in place for 5-10 days, then naturally fall off the skin. Do not pick at the adhesive film.  After Healing: Once the wound has healed, cover the wound with sunscreen during the day for 1 full year. This can help minimize scarring. Exposure to ultraviolet light in the first year will darken the scar. It can take 1-2 years for the scar to lose its redness and to heal completely.  SEEK MEDICAL CARE IF:  You have a fever. SEEK IMMEDIATE  MEDICAL CARE IF:  You have redness, pain, or swelling around the wound.   You see ayellowish-white fluid (pus) coming from the wound.    This information is not intended to replace advice given to you by your health care provider. Make sure you discuss any questions you have with your health care provider.   Document Released: 02/21/2004 Document Revised: 02/03/2014 Document Reviewed: 08/26/2012 Elsevier Interactive Patient Education 2016 [place], Adult A laceration is a cut that goes through all layers of the skin. The cut also goes into the tissue that is right under the skin. Some cuts heal on their own. Others need to be closed with stitches (sutures), staples, skin adhesive strips, or wound glue. Taking care of your cut lowers your risk of infection and helps your cut to heal better. HOW TO TAKE CARE OF YOUR CUT For stitches or staples:  Keep the wound clean and dry.  If you were given a bandage (dressing), you should change it at least one time per day or as told by your doctor. You should also change it if it gets wet or dirty.  Keep the wound completely dry for the first 24 hours or as told by your doctor. After that time, you may take a shower or a bath. However, make sure that the wound is not soaked in water until after the stitches or staples have been removed.  Clean the wound one time each day or as told by your doctor:  Wash the wound with soap and water.  Rinse the wound with water until all of the soap comes off.  Pat the wound dry with a clean towel. Do not rub the wound.  After you clean the wound, put a thin layer of antibiotic ointment on it as told by your doctor. This ointment:  Helps to prevent infection.  Keeps the bandage from sticking to the wound.  Have your stitches or staples removed as told by your doctor. If your doctor used skin adhesive strips:   Keep the wound clean and dry.  If you were given a bandage, you should  change it at least one time per day or as told by your doctor. You should also change it if it gets dirty or wet.  Do not get the skin adhesive strips wet. You can take a shower or a bath, but be careful to keep the wound dry.  If the wound gets wet, pat it dry with a clean towel. Do not rub the wound.  Skin adhesive strips fall off on their own. You can trim the strips as the wound heals. Do not remove any strips that are still stuck to the wound. They will fall off after a while. If your doctor used wound glue:  Try to keep your wound dry, but you may briefly wet it in the shower or bath. Do  not soak the wound in water, such as by swimming. °· After you take a shower or a bath, gently pat the wound dry with a clean towel. Do not rub the wound. °· Do not do any activities that will make you really sweaty until the skin glue has fallen off on its own. °· Do not apply liquid, cream, or ointment medicine to your wound while the skin glue is still on. °· If you were given a bandage, you should change it at least one time per day or as told by your doctor. You should also change it if it gets dirty or wet. °· If a bandage is placed over the wound, do not let the tape for the bandage touch the skin glue. °· Do not pick at the glue. The skin glue usually stays on for 5-10 days. Then, it falls off of the skin. °General Instructions  °· To help prevent scarring, make sure to cover your wound with sunscreen whenever you are outside after stitches are removed, after adhesive strips are removed, or when wound glue stays in place and the wound is healed. Make sure to wear a sunscreen of at least 30 SPF. °· Take over-the-counter and prescription medicines only as told by your doctor. °· If you were given antibiotic medicine or ointment, take or apply it as told by your doctor. Do not stop using the antibiotic even if your wound is getting better. °· Do not scratch or pick at the wound. °· Keep all follow-up visits as  told by your doctor. This is important. °· Check your wound every day for signs of infection. Watch for: °¨ Redness, swelling, or pain. °¨ Fluid, blood, or pus. °· Raise (elevate) the injured area above the level of your heart while you are sitting or lying down, if possible. °GET HELP IF: °· You got a tetanus shot and you have any of these problems at the injection site: °¨ Swelling. °¨ Very bad pain. °¨ Redness. °¨ Bleeding. °· You have a fever. °· A wound that was closed breaks open. °· You notice a bad smell coming from your wound or your bandage. °· You notice something coming out of the wound, such as wood or glass. °· Medicine does not help your pain. °· You have more redness, swelling, or pain at the site of your wound. °· You have fluid, blood, or pus coming from your wound. °· You notice a change in the color of your skin near your wound. °· You need to change the bandage often because fluid, blood, or pus is coming from the wound. °· You start to have a new rash. °· You start to have numbness around the wound. °GET HELP RIGHT AWAY IF: °· You have very bad swelling around the wound. °· Your pain suddenly gets worse and is very bad. °· You notice painful lumps near the wound or on skin that is anywhere on your body. °· You have a red streak going away from your wound. °· The wound is on your hand or foot and you cannot move a finger or toe like you usually can. °· The wound is on your hand or foot and you notice that your fingers or toes look pale or bluish. °  °This information is not intended to replace advice given to you by your health care provider. Make sure you discuss any questions you have with your health care provider. °  °Document Released: 07/02/2007 Document Revised: 05/30/2014 Document Reviewed: 01/09/2014 °Elsevier   Interactive Patient Education ©2016 [employer] ° °

## 2015-01-05 ENCOUNTER — Encounter (HOSPITAL_COMMUNITY): Payer: Self-pay | Admitting: *Deleted

## 2015-01-05 ENCOUNTER — Emergency Department (HOSPITAL_COMMUNITY)
Admission: EM | Admit: 2015-01-05 | Discharge: 2015-01-05 | Disposition: A | Discharge status: Home / Self Care | Payer: Federal, State, Local not specified - PPO | Attending: Emergency Medicine | Admitting: Emergency Medicine

## 2015-01-05 DIAGNOSIS — J45909 Unspecified asthma, uncomplicated: Secondary | ICD-10-CM | POA: Diagnosis not present

## 2015-01-05 DIAGNOSIS — Z7951 Long term (current) use of inhaled steroids: Secondary | ICD-10-CM | POA: Insufficient documentation

## 2015-01-05 DIAGNOSIS — Z79899 Other long term (current) drug therapy: Secondary | ICD-10-CM | POA: Diagnosis not present

## 2015-01-05 DIAGNOSIS — Z4802 Encounter for removal of sutures: Secondary | ICD-10-CM

## 2015-01-05 NOTE — Discharge Instructions (Signed)
Take tylenol every 4 hours as needed and if over 6 mo of age take motrin (ibuprofen) every 6 hours as needed for fever or pain. Return for any changes, weird rashes, neck stiffness, change in behavior, new or worsening concerns.  Follow up with your physician as directed. Thank you Filed Vitals:   01/05/15 2000  BP: 138/91  Pulse: 87  Temp: 98.2 F (36.8 C)  TempSrc: Oral  Resp: 16  SpO2: 99%

## 2015-01-05 NOTE — ED Notes (Signed)
Discharge instructions reviewed.  Instructed to purchase the Mederma to help with scare healing.

## 2015-01-05 NOTE — ED Notes (Signed)
Pt has stitches that were placed on 11/30 that need to be removed.  No signs of infection.

## 2015-01-05 NOTE — ED Provider Notes (Signed)
CSN: [id number]     Arrival date & time 01/05/15  1953 History   First MD Initiated Contact with Patient 01/05/15 2000     Chief Complaint  Patient presents with  . Suture / Staple Removal     (Consider location/radiation/quality/duration/timing/severity/associated sxs/prior Treatment) HPI Comments: 24 year old female with no significant medical history presents for suture removal. Laceration occurred proximal me 9 days ago. Healing well no fevers no drainage no redness.  Patient is a 24 y.o. female presenting with suture removal. The history is provided by the patient.  Suture / Staple Removal    Past Medical History  Diagnosis Date  . Asthma    History reviewed. No pertinent past surgical history. No family history on file. Social History  Substance Use Topics  . Smoking status: Never Smoker   . Smokeless tobacco: Never Used  . Alcohol Use: Yes   OB History    No data available     Review of Systems  Constitutional: Negative for fever.  Skin: Positive for wound. Negative for rash.      Allergies  Review of patient's allergies indicates no known allergies.  Home Medications   Prior to Admission medications   Medication Sig Start Date End Date Taking? Authorizing Provider  albuterol (PROVENTIL HFA;VENTOLIN HFA) 108 (90 BASE) MCG/ACT inhaler Inhale 1-2 puffs into the lungs every 6 (six) hours as needed for wheezing or shortness of breath.    Historical Provider, MD  albuterol (PROVENTIL) (2.5 MG/3ML) 0.083% nebulizer solution Take 2.5 mg by nebulization every 6 (six) hours as needed for wheezing or shortness of breath.     Historical Provider, MD  aspirin-acetaminophen-caffeine (EXCEDRIN MIGRAINE) [phone] MG tablet Take 2 tablets by mouth every 6 (six) hours as needed for headache.    Historical Provider, MD  bacitracin ointment Apply 1 application topically 2 (two) times daily. 12/27/14   Antony Madura, PA-C  budesonide-formoterol (SYMBICORT) 160-4.5 MCG/ACT  inhaler Inhale 2 puffs into the lungs 2 (two) times daily. 11/02/13   Rodolph Bong, MD  metoCLOPramide (REGLAN) 10 MG tablet Take 1 tablet (10 mg total) by mouth every 6 (six) hours as needed for nausea (or headache). 11/19/14   Dione Booze, MD   BP 138/91 mmHg  Pulse 87  Temp(Src) 98.2 F (36.8 C) (Oral)  Resp 16  SpO2 99%  LMP 12/25/2014 Physical Exam  Constitutional: She appears well-developed and well-nourished. No distress.  HENT:  Head: Normocephalic.  2 cm healed laceration with small scabbed area and scar left for head. No signs of infection. For sutures in place.  Pulmonary/Chest: Effort normal.  Skin: Skin is warm.  Nursing note and vitals reviewed.   ED Course  Procedures (including critical care time)  SUTURE REMOVAL Performed by: Enid Skeens  Consent: Verbal consent obtained. Consent given by: patient Required items: required blood products, implants, devices, and special equipment available  Location: forehead Wound Appearance: clean Sutures/Staples Removed: 4  Patient tolerance: Patient tolerated the procedure well with no immediate complications.      Labs Review Labs Reviewed - No data to display  Imaging Review No results found. I have personally reviewed and evaluated these images and lab results as part of my medical decision-making.   EKG Interpretation None      MDM   Final diagnoses:  Visit for suture removal    Wound healed well, sutures removed.  Results and differential diagnosis were discussed with the patient/parent/guardian. Xrays were independently reviewed by myself.  Close follow up outpatient  was discussed, comfortable with the plan.   Medications - No data to display  Filed Vitals:   01/05/15 2000  BP: 138/91  Pulse: 87  Temp: 98.2 F (36.8 C)  TempSrc: Oral  Resp: 16  SpO2: 99%    Final diagnoses:  Visit for suture removal        Blane OharaJoshua Falyn Rubel, MD 01/05/15 2024

## 2015-01-05 NOTE — ED Notes (Signed)
Sutures removed by Dr Jodi MourningZavitz.  Wound with well approximated edges and no drainage noted.

## 2016-05-03 IMAGING — CT CT HEAD W/O CM
2 series · 16 of 30 positions shown, 18 images · non-contrast
Comparison: 03/27/2014

CLINICAL DATA: 23-year-old female with subacute headaches for
several months.

EXAM:
CT HEAD WITHOUT CONTRAST
TECHNIQUE: Contiguous axial images were obtained from the base of the skull
through the vertex without intravenous contrast.

[Series 201: head w/o, idose (1) · axial · non-contrast · 0.38mm/px · z∈[+93,+203]mm · 8 of 30 slices shown, 10 images]
[im 4/30  brain]
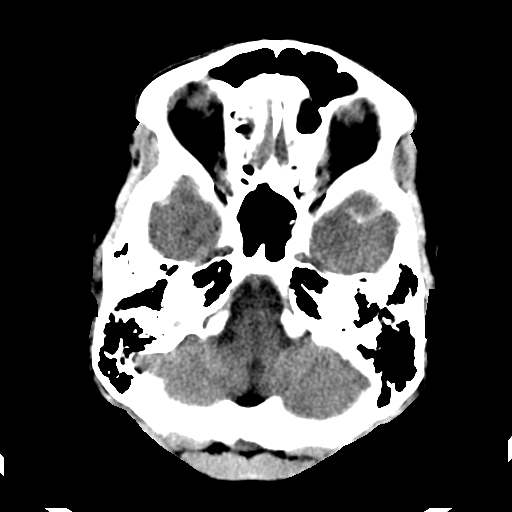
[im 4/30  bone]
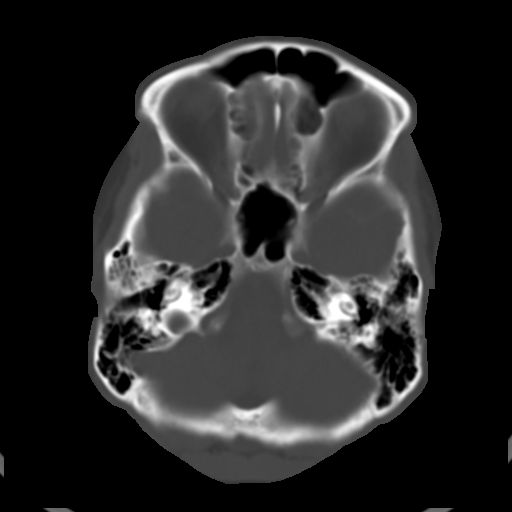
[im 7/30  brain]
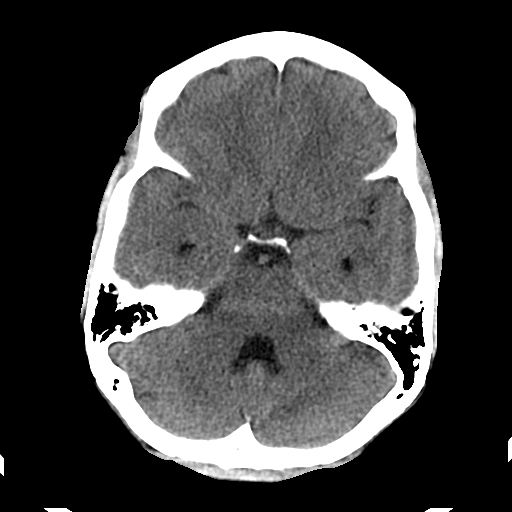
[im 10/30  brain]
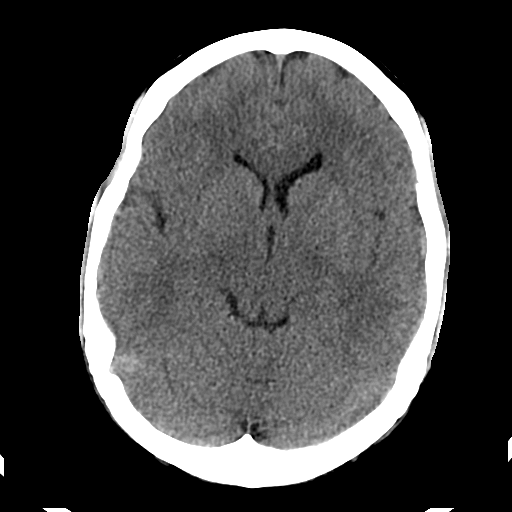
[im 13/30  brain]
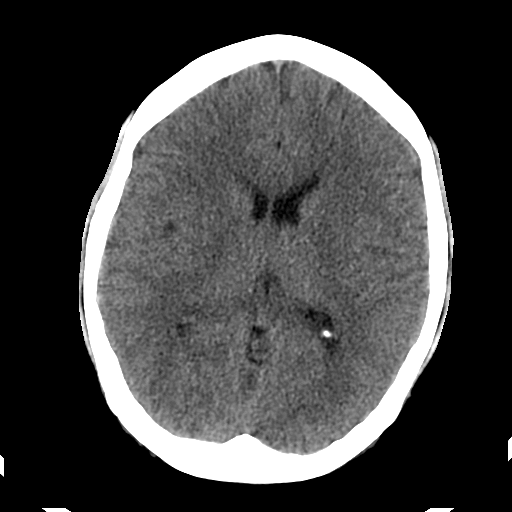
[im 17/30  brain]
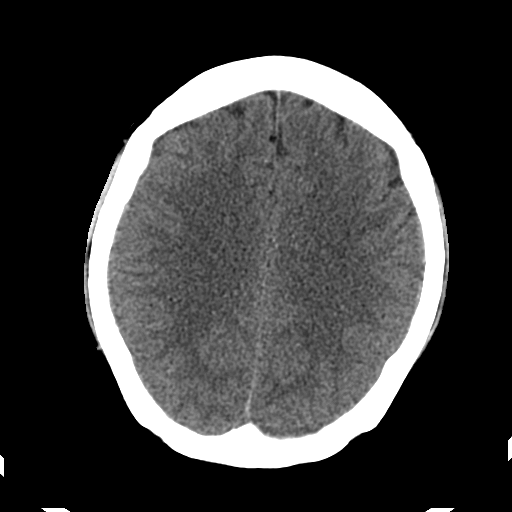
[im 17/30  bone]
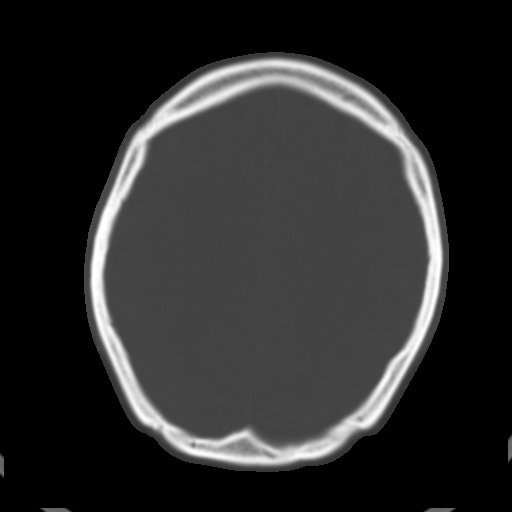
[im 20/30  brain]
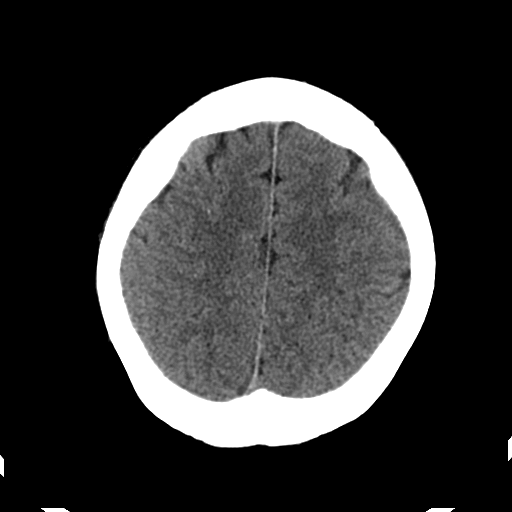
[im 23/30  brain]
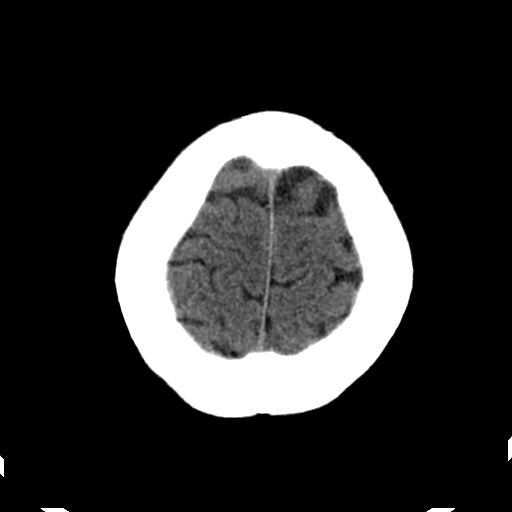
[im 26/30  brain]
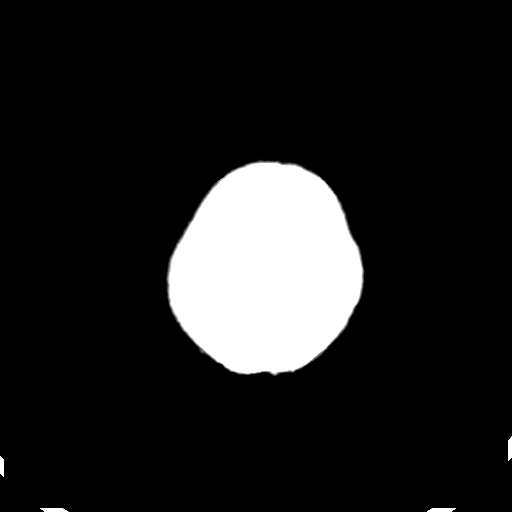

[Series 202: head w/o bone, idose (1) · axial · non-contrast · 0.38mm/px · z∈[+91,+206]mm · 8 of 60 slices shown]
[im 7/60  bone]
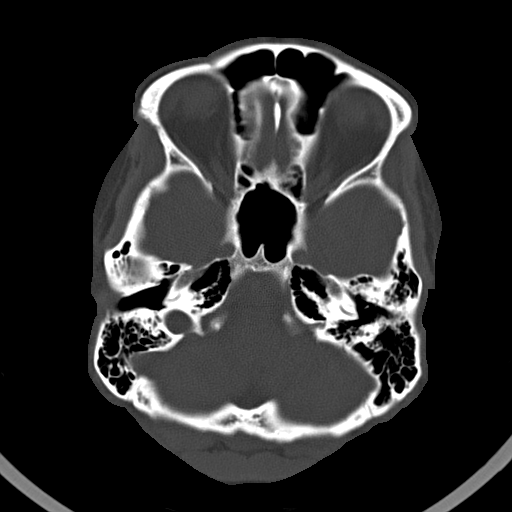
[im 13/60  bone]
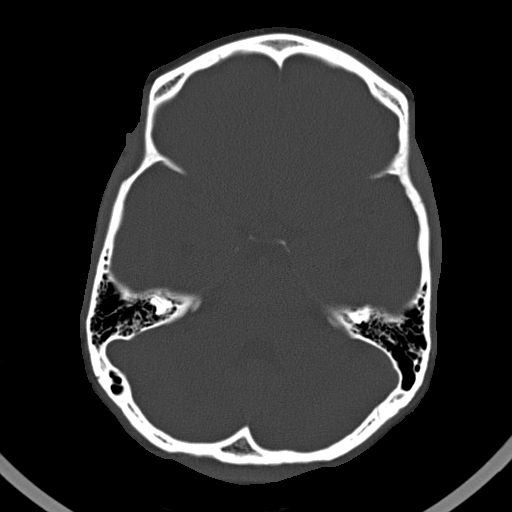
[im 19/60  bone]
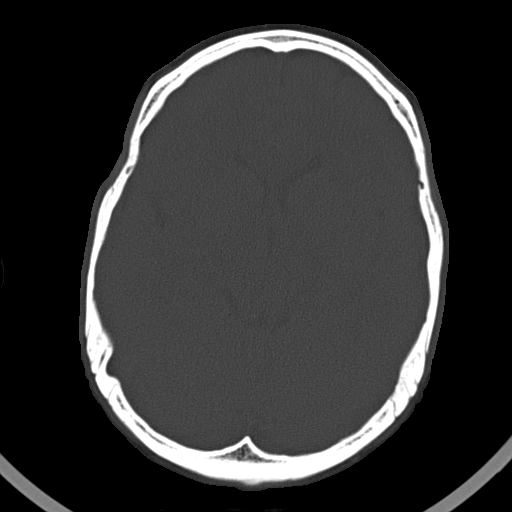
[im 25/60  bone]
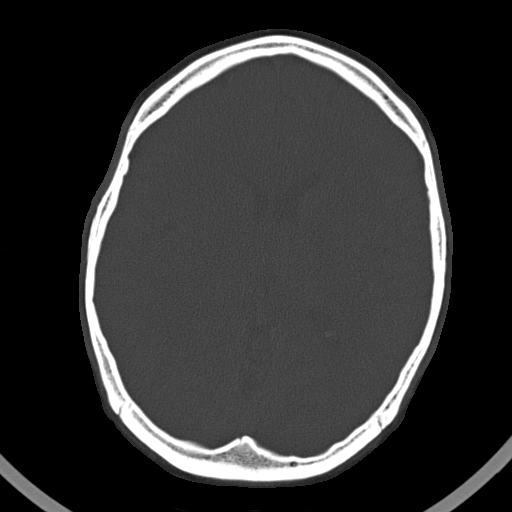
[im 35/60  bone]
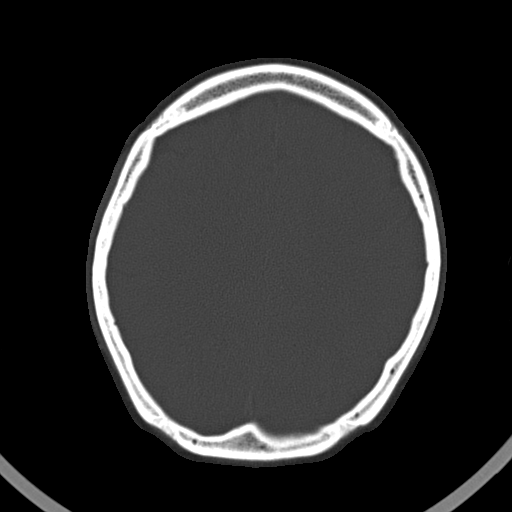
[im 41/60  bone]
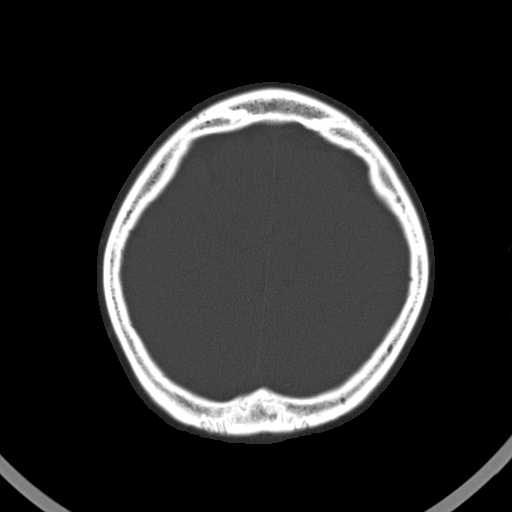
[im 47/60  bone]
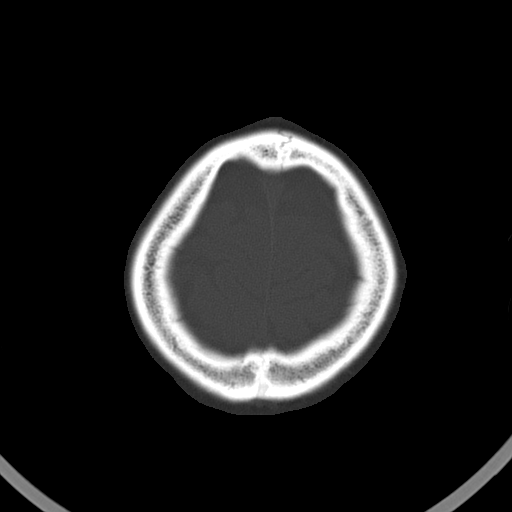
[im 53/60  bone]
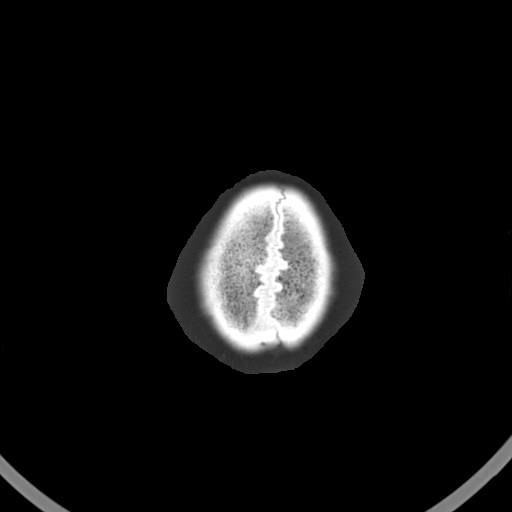

[16 of 30 positions shown; findings below may reference images not displayed]

FINDINGS: No intracranial abnormalities are identified, including mass lesion
or mass effect, hydrocephalus, extra-axial fluid collection, midline
shift, hemorrhage, or acute infarction.

The visualized bony calvarium is unremarkable.
IMPRESSION: Unremarkable noncontrast head CT.

## 2017-02-02 ENCOUNTER — Ambulatory Visit: Payer: Worker's Comp, Other unspecified | Attending: Nurse Practitioner

## 2017-02-02 DIAGNOSIS — H5310 Unspecified subjective visual disturbances: Secondary | ICD-10-CM | POA: Insufficient documentation

## 2017-02-02 DIAGNOSIS — R42 Dizziness and giddiness: Secondary | ICD-10-CM | POA: Insufficient documentation

## 2017-02-05 NOTE — Rehab Evaluation ([facility]) (Signed)
Ruth Sosa  MRN: [id number]  Account: [id number]  Session Start: 02/04/2017 3:45:00 PM  Session Stop: 02/04/2017 4:28:00 PM    Physical Therapy  Outpatient Concussion Evaluation    Medical Diagnosis:  Concussion  Therapy Diagnosis:  Rank Code      Description     Date of Onset    1    R42.      Dizziness and giddiness                          02/05/2017  2    H53.1     Subjective visual disturbances                   02/05/2017  Demographics:            Age: 27Y            Gender: Female  Primary Language: English    Referring Clinician: Gwyndolyn Kaufman, NP  Athletic Trainer: The patient does not have an Event organiser.    Past Medical History:  Anxiety: No  Migraines:No  Depression: No  Learning Disability: No  ADD/ADHD: No  Syncope: No  Previous Concussion:  No  Car Sickness: Yes  Sleep Disorders:  No  Wears Corrective Lens: Glasses to drive at night. Last eye exam November 2018.    History of Present Illness:  Date of Onset: 01/02/2017  Additional Information: Patient was working at [employer] in [place].  Patient was sitting on the floor cleaning on the shelf when she was it in the  head by three boxes of rainboots that fell on her head. She felt dizzy and had a  headache. She left work early and went to Urgent Care.  Imaging: No imaging was performed after the concussion.  Premorbid Function:  Name of School/Place of Employment: Works full time at [employer]  Description of Academic or Work Load: 5 days a week, 8 hours per day  Sport/Activity: None  Summary of Return to School/Work Attempt: Patient was off of work for two weeks.  Patient reports feeling dizzy when she returned to work.    Medications and Allergies:   See the medication list under the media tab in EPIC.    Rehabilitation Precautions/Restrictions:   Avoid head threatening activities   No driving.    SUBJECTIVE  Patient Report: Patient reports she has had some difficulty remembering details  during conversations with friends. Patient reports some  dizziness. Patient also  reports having some difficulties with word finding/combining words.  Pain: Patient currently without complaints of pain.    Symptom History:                       Today  SYMPTOMS  Headache             0  Nausea               3  Vomiting             0  Balance Dysfunction  0  Dizziness            4  Lightheadedness      0  Fatigue              0  Trouble Fall Asleep  4  Increased Sleep      0  Decreased Sleep      2  Drowsiness  0  Photophobia          2  Phonophobia          0  Irritability         0  Sadness              0  Nervous /Anxiety     2  More Emotional       0  Parasthesis          0  Feeling Slowed Down  0  Concentration        0  Memory               5  Visual Changes       0    *Symptoms graded on a scale of 0-6 (0 = no symptoms, 6 = severe)    Current Functional Limitations:  Patient reports difficulty remembering, trouble falling asleep, sensitivity to  light, dizziness, and nausea.  Patient/Caregiver Goals: Patient's functional goals: Return to prior level of  function.    OBJECTIVE  General Observation: Pt received on time, ambulates independently without  distress.    Cervical Range of Motion: WNL for active cervical rotation, flexion, extension.  Strength: WNL for cervical isometrics.  Sensation: Intact for light touch UEs/LEs.  Gross Motor Coordination:  Intact for finger opposition, alternating  pronation/supination, and toe tapping.    Neurocognitive Screen:  Immediate Memory   5 word list: 4/5, 5/5, 5/5    TOTAL: 14  Concentration: Digits Backwards   3 numbers: 2/2   4 numbers: 2/2    TOTAL: 4    Occulomotor Screen:                       Today  TEST  Smooth Pursuit       Reports mild dizziness  Saccades             Mild dizziness horizontal, mild correction vertical  Vergence             Denies double  Range of Motion      WNL  VOR/Head Thrust      Moderate dizziness, decreased speed  Acuity               20/10  DVA                  20/25  Accomodation          2.5''    Patient wears glasses for distance (did not bring today)    Balance:  Modified CTSIB  Condition 1 (eyes open/firm): 30 seconds without visible postural sway  Condition 2 (eyes closed/firm): 30 seconds without visible postural sway  Condition 3 (eyes open/foam): 30 seconds without visible postural sway  Condition 4 (eyes closed/foam): 30 seconds with moderate postural sway  Tandem walking: able to complete without unsteadiness  Gait with head turns: able to complete vertical and horizontal head movements  without path deviation; symptoms reported with horizontal head movements.    Other/Additional Findings: N/A    Interventions:  Low Complexity Evaluation: A history with no personal factors and/or  comorbidities that impact the plan of care  An examination of body system(s) using standardized tests and measures  addressing 1-2 elements from any of the following: body structures and  functions, activity limitations, and/or participation restrictions  A clinical presentation with stable and/or uncomplicated characteristics  Clinical decision-making of low complexity using standardized patient assessment  instrument and/or measurable assessment  of functional outcome  Pain Reassessment: Patient reports no increase in overall symptoms at end of  session.  Education:  Learning Preference: The patient's preferred learning method is: Explanation.  Demonstration. Programme researcher, broadcasting/film/video.  Barriers to Learning: No barriers.  Learning Needs: Concussion recovery guidelines, Functional activities/mobility,  Plan of care    Education Provided:  Concussion Recovery Guidelines: Diet management, Hydration management, Sleep  management, Active recovery. Plan of care. Home exercise program.  Audience: Patient.  Mode: Explanation. Demonstration. Programme researcher, broadcasting/film/video.  Response: Applied knowledge. Verbalized understanding. Demonstrated skill.      ASSESSMENT  Response to Evaluation: The session was tolerated well, as evidenced by:  full  participation.  Strengths: Patient is motivated.  Good family support.  No prior history of concussion.  No co-morbid conditions that would contribute to a prolonged recovery.  Summary of Findings:  Patient is a 27 y/o female who sustained a concussion on 01/02/2017 after boxes  fell on her head. She presents with subjective symptoms consistent with a  concussion as well as sensitivity to oculomotor tasks and mild balance  impairments. Patient also reports difficulty with word-finding and impaired  memory and may benefit from SLP services if these symptoms persist. Provided HEP  of smooth pursuits and VOR; recommend follow up in 2-3 weeks.  Necessity: Patient requires physical therapy plan in order to function in  community.  Patient requires a physical therapy plan in order to monitor recovery, guide  gradual return to activities and for therapeutic activity as indicated to  promote full recovery.  Rehabilitation Potential:  Patient?s condition has potential to improve.  I expect that the anticipated improvement is attainable in a  reasonable/predictable period of time.  Motivation/Commitment to Therapy: Good.    Activity/Participation Problem List and Goals: Mobility: Walking and Moving  Around  Goal: LTG (10 visits from 02/04/2017): Pt will ambulate while scanning environment  without symptoms of dizziness.  Functions/Structures Problem List and Goals:  Other: LTG (10 visits from 01/2917): Pt?s oculomotor will return to baseline to  allow pt to complete work related tasks without symptoms.    PLAN  Treatment Frequency, Duration, and Interventions:  Physical Therapy is recommended for 1-2x/week for 10 visits Physical Therapy  treatment is to include: Canalith Repositioning.  Therapeutic Exercise.  Neuromuscular Re-education.  Investment banker, operational.  Manual Therapy.  Therapeutic Activity.  Recommended Consults: SLP if memory/word finding issues persist.  Development of Plan of Care: Patient participated in and agreed to  plan of care  development today.  The patient has been instructed to contact our clinic if any questions or  problems should arise.    Visit Number: Today's visit is number  1  _____________________________________________________________    Medicare Physician Certification: This is to certify that the above named  patient, who is under my care, requires skilled Physical Therapy  services as  described in the above treatment plan.  I further certify that the services  outlined in this plan are skilled and medically necessary.  I have reviewed this  plan for rehabilitation services, and I recommend that these services continue  for up to 10 visits from 02/04/2017 to meet the goals stated above.    Physician signature: __________________ MD,  Date of certification:  ____/____/____    Signed by: Brent General, PT, DPT 02/04/2017 4:30:00 PM

## 2017-02-10 NOTE — Progress Notes (Signed)
[facility]  [address] [place] [address]  Phone: [phone]     Fax: [phone]    CONCUSSION CLINIC ASSESSMENT    PATIENT: Ruth Sosa DOB: [birth year]   MR #: [id number]  AGE: 27 y.o.    DATE OF VISIT:  02/11/17 PRIMARY MD: No primary care provider on file.      History of Present Illness:     DOI: 01/02/17  MOI: Ruth Sosa is a 27 y.o. year old female  who presents for an evaluation of concussion.  Sitting on the floor at work and 3 pairs of rain boots fell on the top of her head. No LOC-had HA and dizziness right away. Evaluated in UC. Returned to work 01/13/17.    Imaging:  none      Pt has a symptoms score of:  22   See scanned PCSS form in chart for details.  Primary symptom complaint today:  HA    Past Medical History:     Anxiety: No  Migraines: Yes saw a headache specialist while in college-resolved after she graducated.  Depression: No  Learning Disability: No  ADD/ADHD: No  Syncope: No  Previous Concussion:  No  Car Sickness: Yes  Sleep Disorders:  No  Wears Corrective Lens: Contacts-last eye exam 10/18  Asthma    Family History:     Migraine: No  Depression/anxiety:No    Social History:     Lives with Parents/family.  [employer]  Treadmill for exercise  No daily caffeine, alcohol or tobaccol    Allergies:      Allergies not on file    Medications:     Ruth Sosa currently has no medications in their medication list.    Review of Systems:   See scanned Case History/PCSS form for patient's numerical rating of symptoms.     Constitutional: Negative for fatigue/malaise.    Respiratory: Negative for cough and shortness of breath.  Gastrointestinal: Negative for nausea, negative for vomiting and abdominal pain.   Skin: Negative for ecchymosis, rash, pruritis.    Musculoskeletal: Negative for back pain, Positive for neck pain.  Neurological: Positive for headaches, Negative for dizziness. Negative for tingling, slurred speech .  HENT:  Negative for noise sensitivity.  Positive for photophobia and Negative blurred vision with pro-longed concentration.      Psychiatric/Behavioral: Positive for decreased concentration.   Positive for difficulty remembering.   The patient is  having difficulty falling asleep or with fragmented sleep.   The patient is  more emotional, irritable, anxious or sad than normal.    Physical Exam:   General appearance - well developed, alert and oriented.  Mental status/ Psych: good eye contact, affect WNL  Head - normocephalic, atraumatic  Eyes - PERRLA,  extraocular eye movements intact. No nystagmus.  Ears/Nose-  external ear canals normal.  No drainage or blood in ears, normal tms  Mouth - mucous membranes moist  Neck - supple,  FROM, no pain over spine, +  paraspinal TTP.  Chest - easy respiratory effort, equal chest rise, no distress or cough.  Heart - pink, well perfused skin.  Neurological - AAOx3, gross motor intact, alert,  no focal findings, motor and sensory grossly normal bilaterally,  Romberg sign negative, gait and station steady.   finger to nose and coordination intact.   Skin: warm, dry, no wound  136/85 84 18 98%  Focused exam by physical therapy found:   Oculomotor and vestibular dysfunction  Assessment:   Concussion without initial loss of consciousness, symptoms still occurring.    Based on my evaluation, my opinion is that the patient has sustained a definite concussion with improving symptoms  . There are not risk factors for a prolonged recovery.     I have reviewed previous medical records.   Case discussed with:  Physical Therapy     Education provided to patient and family regarding the pathophysiology and typical recovery following concussions.  Also discussed the importance of avoiding head threatening activities to prevent further injury to include second impact syndrome.  Patient educated on the benefits of a gradual step-wise progression back to daily activities to support recovery.    I  have instructed the patient on the benefits of a healthy diet with an increase in protein intake, reviewed hydration and the importance of a regular sleep pattern and sleep hygiene to support recovery. In terms of exercise, I have recommend the patient to start with light cardiovascular exercise, such as a leisurely walk and to progress to moderate levels of cardiovascular exercise as tolerated.     Patient educated on the below supplements that may be taken IF NEEDED:  1.    For Headache prevention/sleep hygiene: May start Vitamin B2 (Riboflavin) 200-400mg  by mouth daily with breakfast and  Magnesium Oxide 250-500mg  by mouth daily with breakfast.  Appropriate dosing for age discussed.    2.   You may take Tylenol or Motrin  as needed for breakthrough headaches but try not to take consistently as this can cause rebound headaches. Add a daily vitamin.   3.   Melatonin 3-5 mg one hour before bed for sleep disturbance and benadryl if needed.( instructions on dose and side effects given).   4.  Omega 3 (Fish Oil) 1200mg  twice day a  for general overall brain recovery.    Discussed strategies to re-enter school/work and manage symptoms during recovery.    Total face to face time with patient/family was 45 minutes with more than half the time spent in counseling and coordination of care.     Plan:     This patients has  impairments that we will monitor weekly and treat as indicated by our concussion team.     Follow-up with physical therapy weekly.   Neck stretches given-consider ortho cervical PT  Considerations for physical therapy:    None.    Follow-up with medical provider in 3-4 weeks if concussion symptoms continue and not cleared by Physical Therapy before then.    If Ruth Sosa returns to symptom baseline, has no significant objective findings on clinical exam, and passes TMT and RTP-4 here in clinic patient may be discharged and referred to AthleticTrainer / Coach or Parent to progress through and monitor steps  5/6 in Return to Play Strategy.   Patient has been instructed to notify clinic if they are unable to successfully progress through steps 5/6 in a reasonable time, and to return the RTP checklist to the clinic once successfully completed.     Work Instructions:           Return to Work Recommendations for Ruth Sosa    Concussion is a temporary dysfunction of the brain's normal activity which may affect your ability to learn and work.  It is an energy crisis of the brain, and as such recovery depends on the balance of energy use and conservation.  Returning to activities should follow a step wise fashion, gradually increasing the time you spend in  each activity as tolerated.  Initially, time spent in that activity should be short with frequent rest periods, and gradually lengthened as tolerated.   These modifications will help you conserve energy and continue to progress until a full recovery occurs.      You are in Return to Work Stage:  Leone Haven NP  02/11/17    **This form expires within 2 weeks - Patient advised to return to clinic for updated instructions as needed.**

## 2017-02-11 ENCOUNTER — Ambulatory Visit: Payer: Worker's Comp, Other unspecified | Admitting: Nurse Practitioner

## 2017-02-11 DIAGNOSIS — S060X0A Concussion without loss of consciousness, initial encounter: Secondary | ICD-10-CM

## 2017-02-16 NOTE — Progress Notes (Signed)
Re: Initial visit to concussion clinic 02/11/17 with Lennis Klenk FNP    Review of MLP charts:  I, N. Y. Huberta Tompkins MD, have reviewed the history, physical exam, evaluation, clinical impression and plan and agree.

## 2017-02-27 ENCOUNTER — Ambulatory Visit: Payer: Worker's Comp, Other unspecified | Attending: Nurse Practitioner

## 2017-03-04 ENCOUNTER — Ambulatory Visit: Payer: Worker's Comp, Other unspecified | Admitting: Nurse Practitioner

## 2017-03-04 NOTE — Rehab Progress Note ([facility]) (Signed)
[name]  MRN: [id number]  Account: [id number]  Session Start: 03/04/2017 12:00:00 AM  Session Stop: 03/04/2017 12:00:00 AM    Physical Therapy  Outpatient Missed Visit Note    The patient did not attend the therapy appointment on 03/04/2017 at 1:15PM.    Reason:  Patient forgot about appointment.  Future Appointments: The patient does not have any additional appointments.  Additional appointments were requested to be scheduled.    Signed by: Brent General, PT, DPT 03/04/2017 1:00:00 PM

## 2017-03-04 NOTE — Progress Notes (Deleted)
Pt cancelled appt

## 2017-03-27 ENCOUNTER — Ambulatory Visit: Payer: Worker's Comp, Other unspecified | Attending: Nurse Practitioner

## 2017-04-27 ENCOUNTER — Ambulatory Visit: Payer: Worker's Comp, Other unspecified | Attending: Nurse Practitioner
